# Patient Record
Sex: Female | Born: 1965 | Race: White | Hispanic: No | Smoking: Current every day smoker
Health system: Southern US, Community
[De-identification: ages and names within clinical notes are randomized; demographics above are authoritative.]

## PROBLEM LIST (undated history)

## (undated) DIAGNOSIS — R079 Chest pain, unspecified: Secondary | ICD-10-CM

## (undated) DIAGNOSIS — R296 Repeated falls: Secondary | ICD-10-CM

## (undated) DIAGNOSIS — R42 Dizziness and giddiness: Secondary | ICD-10-CM

## (undated) DIAGNOSIS — E119 Type 2 diabetes mellitus without complications: Secondary | ICD-10-CM

## (undated) DIAGNOSIS — E559 Vitamin D deficiency, unspecified: Secondary | ICD-10-CM

## (undated) DIAGNOSIS — L02811 Cutaneous abscess of head [any part, except face]: Secondary | ICD-10-CM

## (undated) DIAGNOSIS — M0529 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: Secondary | ICD-10-CM

## (undated) DIAGNOSIS — E039 Hypothyroidism, unspecified: Secondary | ICD-10-CM

## (undated) DIAGNOSIS — M791 Myalgia, unspecified site: Secondary | ICD-10-CM

## (undated) DIAGNOSIS — Z79891 Long term (current) use of opiate analgesic: Secondary | ICD-10-CM

## (undated) DIAGNOSIS — R61 Generalized hyperhidrosis: Secondary | ICD-10-CM

## (undated) DIAGNOSIS — K219 Gastro-esophageal reflux disease without esophagitis: Secondary | ICD-10-CM

## (undated) DIAGNOSIS — J349 Unspecified disorder of nose and nasal sinuses: Secondary | ICD-10-CM

## (undated) DIAGNOSIS — R634 Abnormal weight loss: Secondary | ICD-10-CM

## (undated) DIAGNOSIS — R609 Edema, unspecified: Secondary | ICD-10-CM

## (undated) DIAGNOSIS — R0989 Other specified symptoms and signs involving the circulatory and respiratory systems: Secondary | ICD-10-CM

## (undated) DIAGNOSIS — R509 Fever, unspecified: Secondary | ICD-10-CM

## (undated) DIAGNOSIS — M5441 Lumbago with sciatica, right side: Secondary | ICD-10-CM

## (undated) DIAGNOSIS — R011 Cardiac murmur, unspecified: Secondary | ICD-10-CM

## (undated) DIAGNOSIS — M1990 Unspecified osteoarthritis, unspecified site: Secondary | ICD-10-CM

## (undated) DIAGNOSIS — L209 Atopic dermatitis, unspecified: Secondary | ICD-10-CM

## (undated) DIAGNOSIS — J449 Chronic obstructive pulmonary disease, unspecified: Secondary | ICD-10-CM

## (undated) DIAGNOSIS — M542 Cervicalgia: Secondary | ICD-10-CM

## (undated) DIAGNOSIS — I350 Nonrheumatic aortic (valve) stenosis: Secondary | ICD-10-CM

## (undated) DIAGNOSIS — G4733 Obstructive sleep apnea (adult) (pediatric): Secondary | ICD-10-CM

## (undated) DIAGNOSIS — I10 Essential (primary) hypertension: Secondary | ICD-10-CM

## (undated) DIAGNOSIS — E782 Mixed hyperlipidemia: Secondary | ICD-10-CM

## (undated) DIAGNOSIS — F4310 Post-traumatic stress disorder, unspecified: Secondary | ICD-10-CM

## (undated) DIAGNOSIS — M549 Dorsalgia, unspecified: Secondary | ICD-10-CM

## (undated) DIAGNOSIS — J45909 Unspecified asthma, uncomplicated: Secondary | ICD-10-CM

## (undated) DIAGNOSIS — F330 Major depressive disorder, recurrent, mild: Secondary | ICD-10-CM

## (undated) DIAGNOSIS — L03116 Cellulitis of left lower limb: Secondary | ICD-10-CM

## (undated) DIAGNOSIS — J302 Other seasonal allergic rhinitis: Secondary | ICD-10-CM

## (undated) DIAGNOSIS — F419 Anxiety disorder, unspecified: Secondary | ICD-10-CM

## (undated) DIAGNOSIS — R51 Headache: Secondary | ICD-10-CM

## (undated) DIAGNOSIS — D519 Vitamin B12 deficiency anemia, unspecified: Secondary | ICD-10-CM

## (undated) DIAGNOSIS — R519 Headache, unspecified: Secondary | ICD-10-CM

## (undated) DIAGNOSIS — E669 Obesity, unspecified: Secondary | ICD-10-CM

## (undated) DIAGNOSIS — M79669 Pain in unspecified lower leg: Secondary | ICD-10-CM

## (undated) DIAGNOSIS — N329 Bladder disorder, unspecified: Secondary | ICD-10-CM

## (undated) DIAGNOSIS — R5383 Other fatigue: Secondary | ICD-10-CM

## (undated) DIAGNOSIS — Z9289 Personal history of other medical treatment: Secondary | ICD-10-CM

## (undated) HISTORY — DX: Lumbago with sciatica, right side: M54.41

## (undated) HISTORY — DX: Cardiac murmur, unspecified: R01.1

## (undated) HISTORY — DX: Post-traumatic stress disorder, unspecified: F43.10

## (undated) HISTORY — DX: Repeated falls: R29.6

## (undated) HISTORY — DX: Dorsalgia, unspecified: M54.9

## (undated) HISTORY — DX: Chest pain, unspecified: R07.9

## (undated) HISTORY — DX: Headache, unspecified: R51.9

## (undated) HISTORY — DX: Chronic obstructive pulmonary disease, unspecified: J44.9

## (undated) HISTORY — DX: Unspecified asthma, uncomplicated: J45.909

## (undated) HISTORY — DX: Other fatigue: R53.83

## (undated) HISTORY — DX: Vitamin D deficiency, unspecified: E55.9

## (undated) HISTORY — DX: 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: M05.29

## (undated) HISTORY — DX: Hypothyroidism, unspecified: E03.9

## (undated) HISTORY — DX: Fever, unspecified: R50.9

## (undated) HISTORY — DX: Personal history of other medical treatment: Z92.89

## (undated) HISTORY — DX: Pain in unspecified lower leg: M79.669

## (undated) HISTORY — DX: Essential (primary) hypertension: I10

## (undated) HISTORY — DX: Bladder disorder, unspecified: N32.9

## (undated) HISTORY — DX: Unspecified disorder of nose and nasal sinuses: J34.9

## (undated) HISTORY — DX: Other seasonal allergic rhinitis: J30.2

## (undated) HISTORY — DX: Type 2 diabetes mellitus without complications: E11.9

## (undated) HISTORY — DX: Gastro-esophageal reflux disease without esophagitis: K21.9

## (undated) HISTORY — DX: Other specified symptoms and signs involving the circulatory and respiratory systems: R09.89

## (undated) HISTORY — PX: GALLBLADDER SURGERY: SHX652

## (undated) HISTORY — DX: Obstructive sleep apnea (adult) (pediatric): G47.33

## (undated) HISTORY — DX: Obesity, unspecified: E66.9

## (undated) HISTORY — DX: Mixed hyperlipidemia: E78.2

## (undated) HISTORY — DX: Anxiety disorder, unspecified: F41.9

## (undated) HISTORY — DX: Vitamin B12 deficiency anemia, unspecified: D51.9

## (undated) HISTORY — DX: Dizziness and giddiness: R42

## (undated) HISTORY — PX: OTHER SURGICAL HISTORY: SHX169

## (undated) HISTORY — DX: Generalized hyperhidrosis: R61

## (undated) HISTORY — DX: Myalgia, unspecified site: M79.10

## (undated) HISTORY — DX: Long term (current) use of opiate analgesic: Z79.891

## (undated) HISTORY — DX: Headache: R51

## (undated) HISTORY — DX: Major depressive disorder, recurrent, mild: F33.0

## (undated) HISTORY — DX: Abnormal weight loss: R63.4

## (undated) HISTORY — DX: Nonrheumatic aortic (valve) stenosis: I35.0

## (undated) HISTORY — PX: CHOLECYSTECTOMY: SHX55

## (undated) HISTORY — DX: Edema, unspecified: R60.9

## (undated) HISTORY — DX: Cellulitis of left lower limb: L03.116

## (undated) HISTORY — DX: Cervicalgia: M54.2

## (undated) HISTORY — PX: SPLENECTOMY, TOTAL: SHX788

## (undated) HISTORY — DX: Cutaneous abscess of head (any part, except face): L02.811

## (undated) HISTORY — DX: Unspecified osteoarthritis, unspecified site: M19.90

## (undated) HISTORY — DX: Atopic dermatitis, unspecified: L20.9

---

## 2008-11-21 ENCOUNTER — Inpatient Hospital Stay (HOSPITAL_COMMUNITY): Admission: RE | Admit: 2008-11-21 | Discharge: 2008-11-23 | Payer: Self-pay | Admitting: Cardiology

## 2008-11-21 ENCOUNTER — Ambulatory Visit: Payer: Self-pay | Admitting: Cardiology

## 2008-11-23 ENCOUNTER — Encounter: Payer: Self-pay | Admitting: Cardiology

## 2009-08-16 IMAGING — CR DG CHEST 2V
2 series · 2 of 2 positions shown · non-contrast
Comparison: None

CLINICAL DATA: Precardiac catheterization workup.  Chest pain.
Hypertension.

CHEST - 2 VIEW

[view not recorded (1 of 2)]
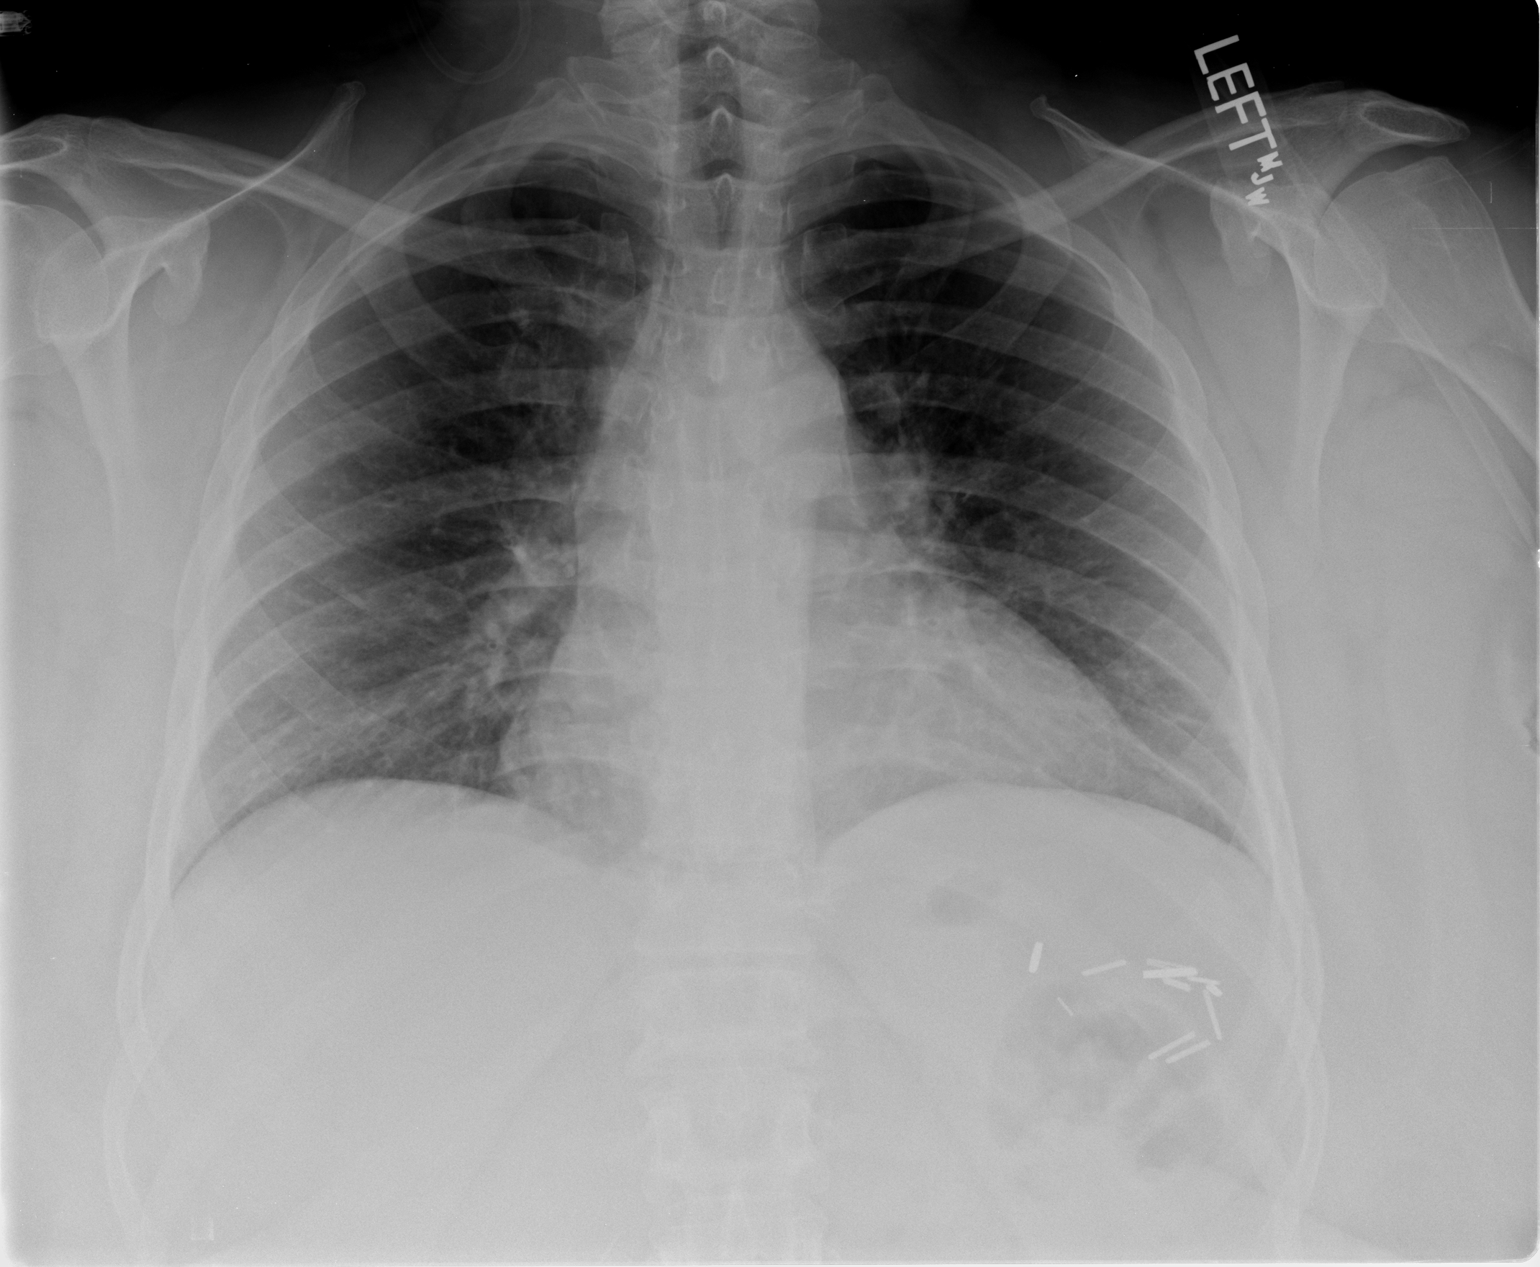

[view not recorded (2 of 2)]
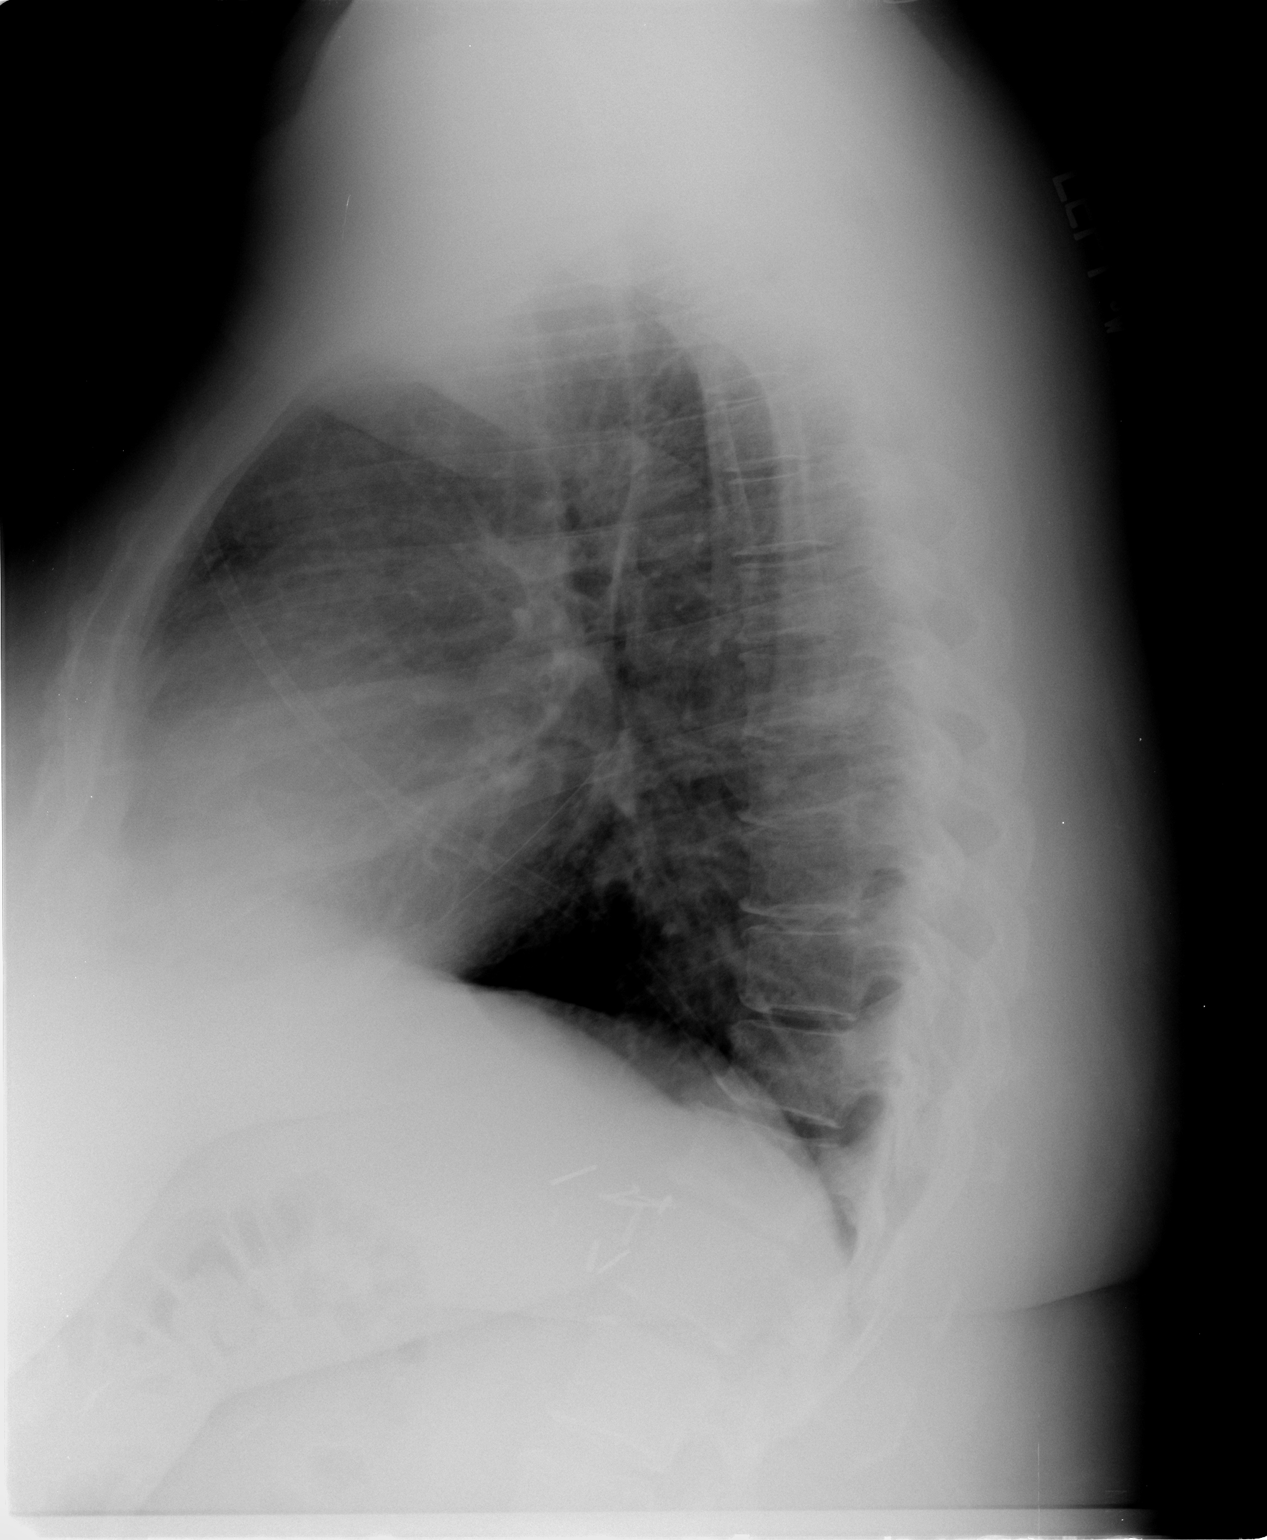

[2 of 2 positions shown; findings below may reference images not displayed]

FINDINGS: No acute chest findings are present.  There is
peribronchial thickening bilaterally compatible with chronic
bronchitic changes.  Heart appears mildly enlarged.  Query right
ventricular hypertrophy on the lateral view.  Metallic surgical
clips in the left upper quadrant.
IMPRESSION: Chronic bronchitic changes.  Cardiomegaly.

## 2009-08-17 IMAGING — CT CT ANGIO CHEST
3 of 8 series · 16 of 36 positions shown · IV contrast (APPLIED)
Comparison: Chest radiograph from 11/21/2008

CLINICAL DATA: Chest pain.  Evaluate for pulmonary embolism.
History of spherocytosis.

CT ANGIOGRAPHY CHEST
TECHNIQUE: Multidetector CT imaging of the chest using the
standard protocol during bolus administration of intravenous
contrast. Multiplanar reconstructed images including MIPs were
obtained and reviewed to evaluate the vascular anatomy.
Contrast: 171 ml Omnipaque

[Series 11: pulm embolism 1.0 b25f thins · axial · 0.65mm/px · z∈[-142,+78]mm · 11 of 264 slices shown]
[im 22/264  lung]
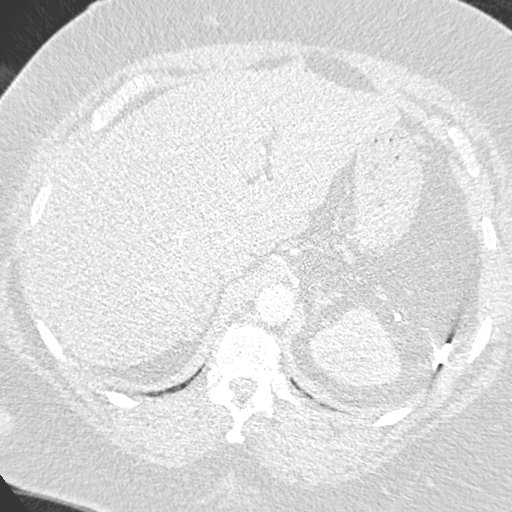
[im 44/264  mediastinal]
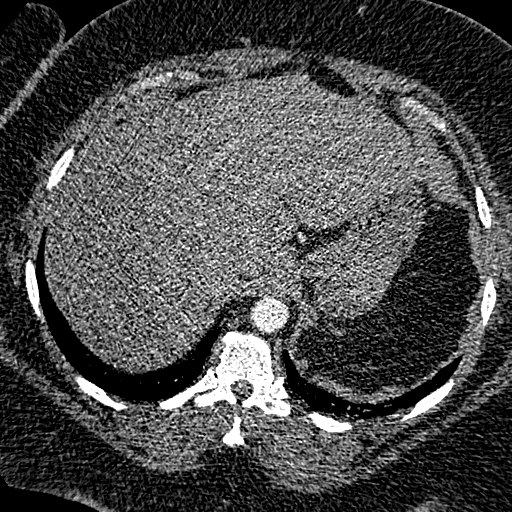
[im 66/264  lung]
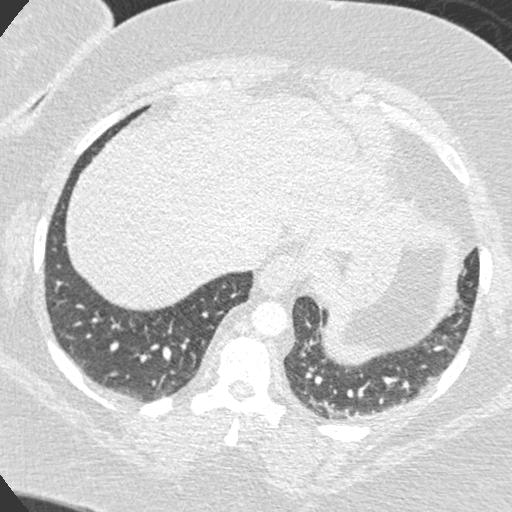
[im 88/264  mediastinal]
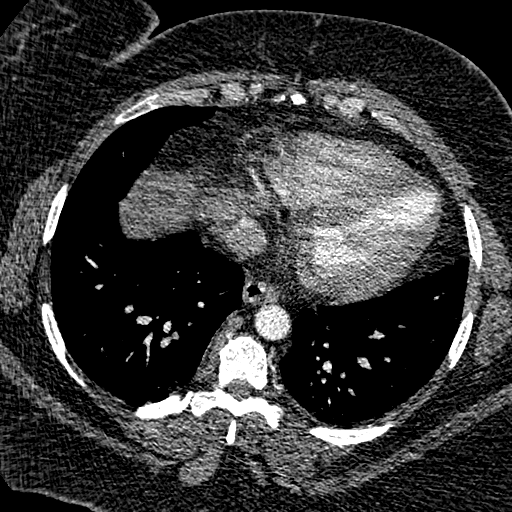
[im 110/264  lung]
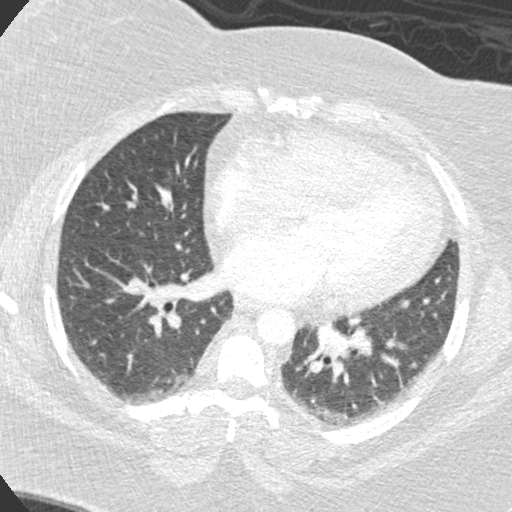
[im 132/264  mediastinal]
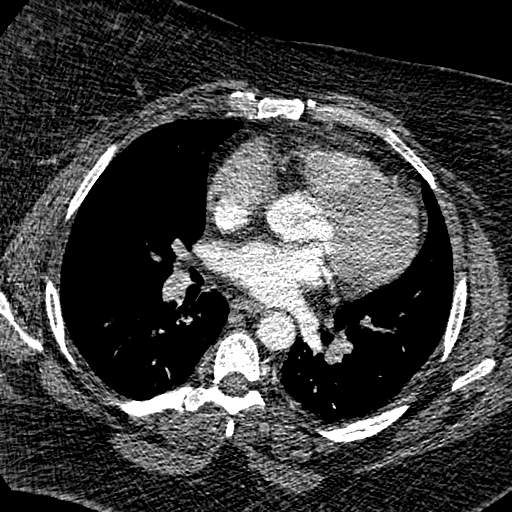
[im 154/264  lung]
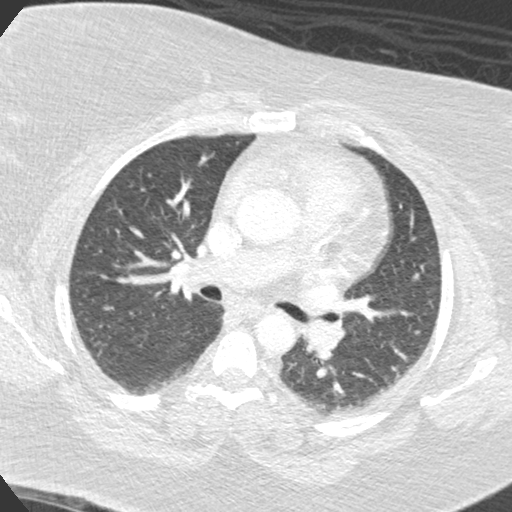
[im 176/264  mediastinal]
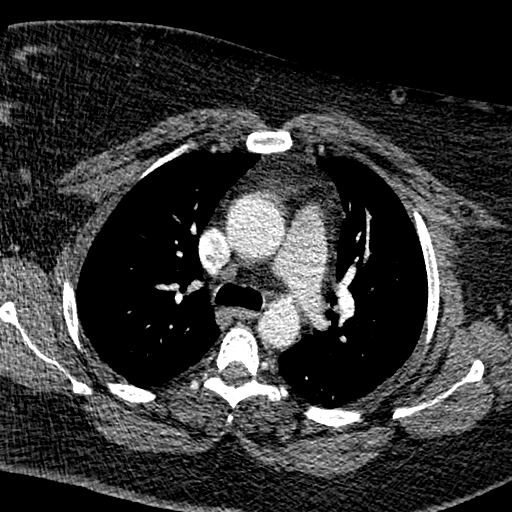
[im 198/264  lung]
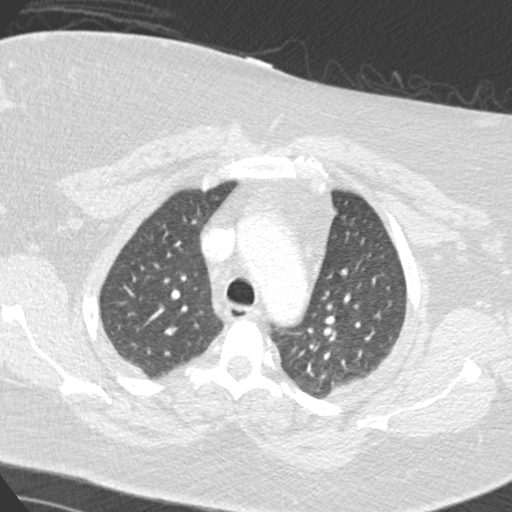
[im 220/264  mediastinal]
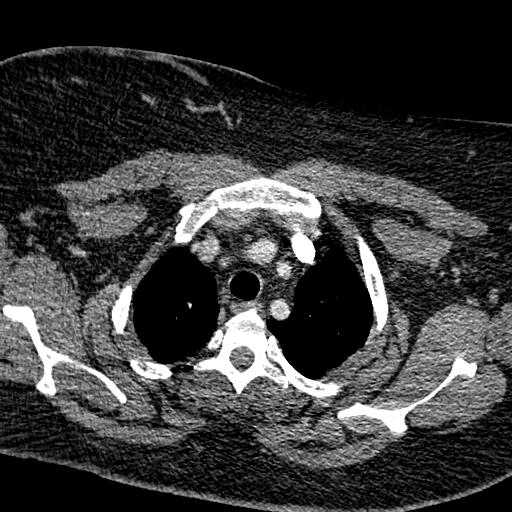
[im 242/264  lung]
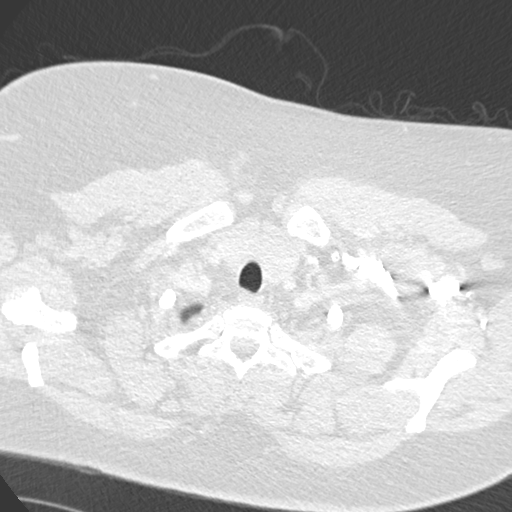

[Series 13: pulm embolism 3.0 b60f lung · axial · 0.63mm/px · z∈[-78,+4]mm · 2 of 83 slices shown]
[im 28/83  mediastinal]
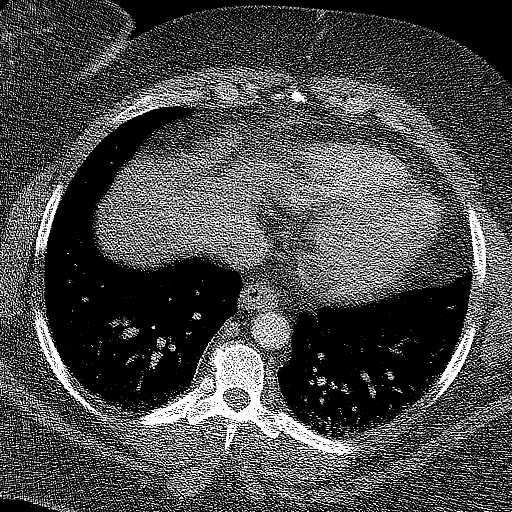
[im 55/83  mediastinal]
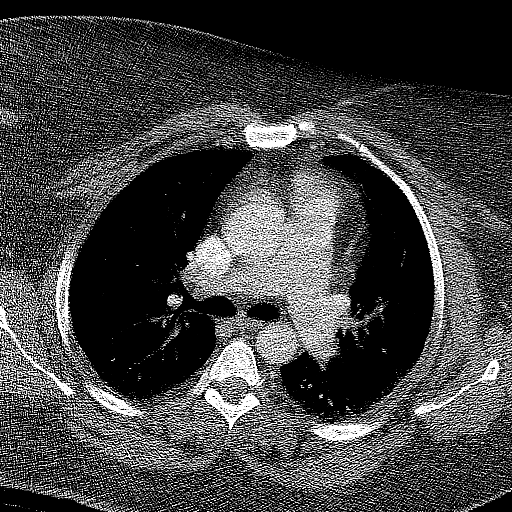

[Series 602: cor angio chest · coronal · 0.63mm/px · 3 of 113 slices shown]
[im 23/113  mediastinal]
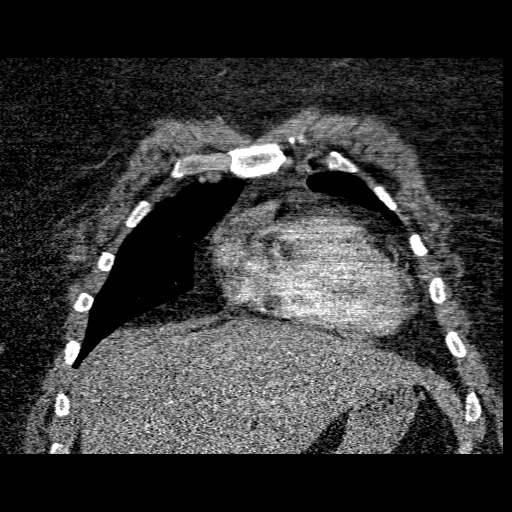
[im 45/113  mediastinal]
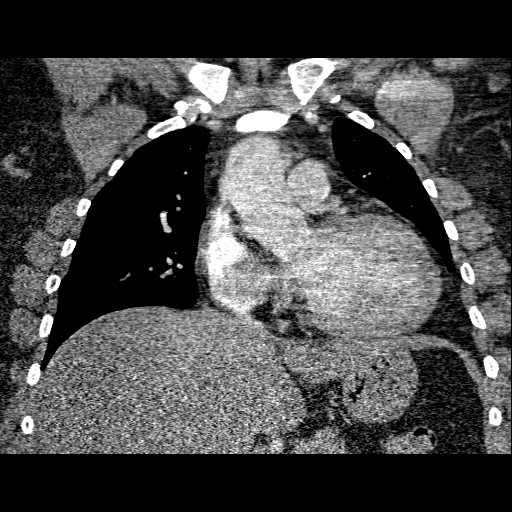
[im 68/113  mediastinal]
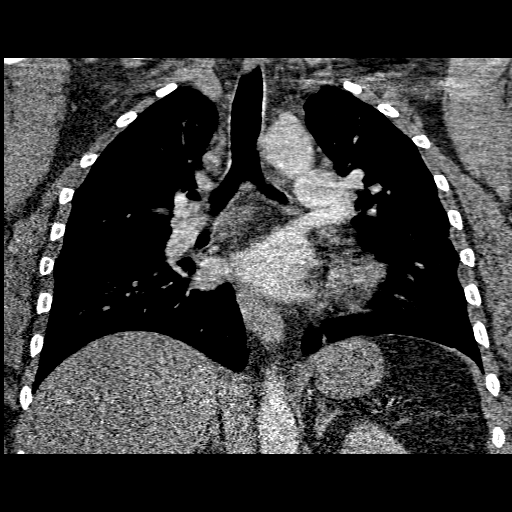

[16 of 36 positions shown; findings below may reference images not displayed]

FINDINGS: Images of the upper abdomen demonstrate changes from a
splenectomy.  No gross abnormality involving the visualized adrenal
structures. The study is nondiagnostic for pulmonary emboli,
despite two attempts to opacify the pulmonary arteries.  The
pulmonary arteries are not adequately opacified.  There is no
significant pericardial or pleural fluid.  There is no significant
chest lymphadenopathy.  There are no gross abnormalities to the
thoracic aorta or great vessels.  The trachea and mainstem bronchi
are patent.  Lungs are clear with exception of dependent
atelectasis.  There are no acute bony abnormalities.
IMPRESSION: Nondiagnostic study for pulmonary embolism.  The patient may be a
candidate for a V/Q scan due to the clear lungs.

No focal parenchymal disease.

Splenectomy.

## 2010-03-14 ENCOUNTER — Ambulatory Visit: Payer: Self-pay | Admitting: Orthopedic Surgery

## 2010-03-14 DIAGNOSIS — M7150 Other bursitis, not elsewhere classified, unspecified site: Secondary | ICD-10-CM

## 2010-03-14 HISTORY — DX: Other bursitis, not elsewhere classified, unspecified site: M71.50

## 2010-05-13 ENCOUNTER — Ambulatory Visit (HOSPITAL_COMMUNITY): Admission: RE | Admit: 2010-05-13 | Discharge: 2010-05-13 | Payer: Self-pay | Admitting: Family Medicine

## 2010-11-03 ENCOUNTER — Emergency Department (HOSPITAL_COMMUNITY)
Admission: EM | Admit: 2010-11-03 | Discharge: 2010-11-03 | Payer: Self-pay | Source: Home / Self Care | Admitting: Emergency Medicine

## 2011-01-21 NOTE — Assessment & Plan Note (Signed)
Summary: rt thiumb trigger finger/[place] medicaid/d allen/bsf   Vital Signs:  Patient profile:   45 year old female Height:      68 inches Weight:      357 pounds Pulse rate:   80 / minute Resp:     18 per minute  Vitals Entered By: Fuller Canada MD (March 14, 2010 10:33 AM)  Visit Type:  Initial Consult Referring Provider:  Dr. Freida Busman Primary Provider:  Dr. Freida Busman [facility]  CC:   thumb pain.  History of Present Illness: 45 year old female with catching locking and pain over the RIGHT thumb presents for evaluation.  She does have catching locking episode which recurred associated with severe pain   Meds: Benazepril, Doxepin, Potassium, Simvastatin, Lasix, Tramadol, ASA, Fish oil.     Allergies (verified): No Known Drug Allergies  Past History:  Past Medical History: htn arthritis cholesterol sleep apnea chronic back pain heart murmur  Past Surgical History:  spleen removed intestinal block gallstones biopsy left breast  Family History: FH of Cancer:  Family History of Diabetes Family History Coronary Heart Disease female < 20 Family History of Arthritis Hx, family, chronic respiratory condition Hx, family, asthma Hx, family, kidney disease NEC  Social History: Patient is widowed.  unemployed trying for disability no smoking no alcohol very little caffeine use  Review of Systems Constitutional:  Complains of weight gain, fever, chills, and fatigue; denies weight loss. Cardiovascular:  Complains of chest pain and murmurs; denies palpitations and fainting. Respiratory:  Complains of short of breath, wheezing, couch, tightness, and snoring; denies pain on inspiration and snoring . Gastrointestinal:  Complains of heartburn and constipation; denies nausea, vomiting, diarrhea, and blood in your stools. Genitourinary:  Complains of frequency; denies urgency, difficulty urinating, painful urination, flank pain, and bleeding in urine. Neurologic:  Complains  of numbness, tingling, and dizziness; denies unsteady gait, tremors, and seizure. Musculoskeletal:  Complains of joint pain, swelling, instability, stiffness, redness, heat, and muscle pain. Endocrine:  Denies excessive thirst, exessive urination, and heat or cold intolerance. Psychiatric:  Complains of nervousness, depression, and anxiety; denies hallucinations. Skin:  Complains of rash and itching; denies changes in the skin, poor healing, and redness. HEENT:  Complains of blurred or double vision and watering; denies eye pain and redness; headache. Immunology:  Complains of seasonal allergies; denies sinus problems and allergic to bee stings. Hemoatologic:  Complains of brusing; denies easy bleeding.  Physical Exam  Additional Exam:  vital signs as recorded.  Patient body habitus large.  Pulse are perfusion normal in the RIGHT thumb.  Tenderness over the A1 pulley palpable nodule in the flexor tendon normal range of motion in the thumb thumb is stable normal flexion strength in the flexor to the IP joint  Skin intact  Patient awake alert oriented x3 mood normal sensation normal     Impression & Recommendations:  Problem # 1:  TRIGGER FINGER (ICD-727.03) Assessment New inject RIGHT thumb trigger Verbal consent was obtained: The finger was prepped with ethyl chloride and injected with 1:1 injection of .25% sensorcaine, 1cc  and 40 mg of depomedrol, 1cc. There were no complications.  Other Orders: New Patient Level II (04540) Depo- Medrol 40mg  (J1030) Joint Aspirate / Injection, Small (98119)  Patient Instructions: 1)  You have received an injection of cortisone today. You may experience increased pain at the injection site. Apply ice pack to the area for 20 minutes every 2 hours and take 2 xtra strength tylenol every 8 hours. This increased pain will usually  resolve in 24 hours. The injection will take effect in 3-10 days.  2)  Please schedule a follow-up appointment as needed.

## 2011-01-21 NOTE — Letter (Signed)
Summary: History form  History form   Imported By: Jacklynn Ganong 03/19/2010 12:57:35  _____________________________________________________________________  External Attachment:    Type:   Image     Comment:   External Document

## 2011-02-04 ENCOUNTER — Other Ambulatory Visit (HOSPITAL_COMMUNITY): Payer: Self-pay

## 2011-02-24 ENCOUNTER — Ambulatory Visit (HOSPITAL_COMMUNITY): Payer: Self-pay

## 2011-03-04 LAB — POCT I-STAT, CHEM 8
BUN: 10 mg/dL (ref 6–23)
Calcium, Ion: 1.06 mmol/L — ABNORMAL LOW (ref 1.12–1.32)
Chloride: 99 mEq/L (ref 96–112)
Creatinine, Ser: 0.7 mg/dL (ref 0.4–1.2)
Potassium: 3.7 mEq/L (ref 3.5–5.1)
Sodium: 136 mEq/L (ref 135–145)

## 2011-03-04 LAB — URINALYSIS, ROUTINE W REFLEX MICROSCOPIC
Bilirubin Urine: NEGATIVE
Glucose, UA: NEGATIVE mg/dL
Leukocytes, UA: NEGATIVE

## 2011-03-04 LAB — URINE MICROSCOPIC-ADD ON

## 2011-03-04 LAB — GLUCOSE, CAPILLARY: Glucose-Capillary: 259 mg/dL — ABNORMAL HIGH (ref 70–99)

## 2011-03-05 ENCOUNTER — Ambulatory Visit (HOSPITAL_COMMUNITY): Payer: Self-pay

## 2011-05-06 NOTE — Discharge Summary (Signed)
Paige Jarvis, [place] NO.:  [id number]   MEDICAL RECORD NO.:  [id number]          PATIENT TYPE:  INP   LOCATION:  2022                         FACILITY:  [facility]   PHYSICIAN:  Arturo Morton. Paige Kill, MD, FACCDATE OF BIRTH:  [birth year]   DATE OF ADMISSION:  11/21/2008  DATE OF DISCHARGE:  11/23/2008                               DISCHARGE SUMMARY   PRIMARY CARDIOLOGIST:  [name], MD, in [place].   PRIMARY CARE Paige Jarvis:  [name] in [place].    CARDIOLOGISTArturo Morton. Paige Kill, MD, [facility]   PROCEDURES PERFORMED DURING HOSPITALIZATION:  Cardiac catheterization.  This was completed by [name] on November 21, 2008.  a.  Hyperdynamic overall left ventricular function, history of  obstructive sleep apnea, calcified coronary artery disease involving all  3 vessels but with noncritical plaque; treat medically.  b.  Please see [name] thorough cardiac catheterization note for  more detailed information.   FINAL DISCHARGE DIAGNOSES:  1. Chronic chest pain.      a.     Status post recent stress Myoview that was negative for       ischemia with an ejection fraction of 65%.      b.     Status post cardiac catheterization on November 21, 2008,       revealing nonobstructive coronary artery disease.  2. Morbid obesity.  3. Hypertension.  4. Dyslipidemia.  5. Sleep apnea.  6. Anemia.  7. Anxiety.  8. Depressive disorder.  9. Urinary tract infection.  10.Severe dental caries and need of removal.   HOSPITAL COURSE:  This is a 42-year obese Caucasian female we are seeing  from [place], [place], who is normally followed by Dr. Thereasa Solo  Dhatt.  We are seeing her today for cardiac catheterization in the  setting of ongoing chest discomfort, dyspnea, and status post stress  test that was negative but has continued to have ongoing chest  discomfort.  The patient was seen and examined by [name] prior  to catheterization, and risks and  benefits were discussed with the  patient and she consented to proceed.  The patient did undergo cardiac  catheterization on November 21, 2008, with results as described above.  The patient has nonobstructive coronary artery disease and will be  treated medically.   The patient was started on nitrates secondary to recurrence of chest  discomfort.  She did have some headache with initiation of the  isosorbide mononitrate, but it is beginning to subside.  The patient  also had some complaints of dysuria during hospitalization.  Urinalysis  was sent for culture, and the patient was started on Cipro 250 mg p.o.  b.i.d. for 5 days.   The patient also had echocardiogram completed during hospitalization on  January 25, 2008, revealing overall left ventricular systolic function  was normal, left ventricular ejection fraction estimated range being 55-  65%, left ventricular wall thickness was mildly increased per [name], cardiologist.   The patient was seen by [name] on day of discharge and a  lengthy discussion greater than 45 minutes  to an hour was had between  Dr. Riley Jarvis and the patient to address all of her concerns and issues.  The patient was reassured concerning her cardiac disease.  He feels that  initially some of this chest discomfort may be related to stress and  sleep apnea.  She has a low likelihood of a PE.  The patient was  explained that nitrates will be added to her current medication regimen.  He had also discussed the need to have her teeth pulled to decrease  inflammatory load with the risk of ACS.  The patient also discussed at  length her insurance issues and she is trying to get Medicaid as she has  not been able to have insurance.  She had been on her husband's  insurance in the past.  Also, the patient had discussion with Dr.  Riley Jarvis concerning weight loss.  He discussed need for increasing  exercise and diet control to assist in this.  Also, Dr.  Riley Jarvis  discussed her UTI and need for antibiotics.  The patient verbalized  understanding.  Dr. Riley Jarvis did send a good bit of time reassuring the  patient after many questions and anxiety was expressed concerning her  health status.  He advices that she follow up with [name] and Dr.  Synetta Fail, primary care physician for continued medical management and  cardiac management with only changes in medicine the addition of  isosorbide and temporary Cipro for UTI.   DISCHARGE LABORATORY DATA:  Sodium 133, potassium 4.0, chloride 101, CO2  of 25, glucose 118, BUN 10, creatinine 0.94.  UA positive for UTI.  Cardiac enzymes were found to be negative x3.  D-dimer 0.58.   Vital signs at discharge, blood pressure 112/74, pulse 82, respirations  20, temperature 98.3, O2 sat 98% on room air.   CT of the chest dated November 22, 2008, revealed nondiagnostic study for  pulmonary embolism.  The patient may be a candidate for V/Q lung scan  due to clear lungs.  Chest x-ray dated November 21, 2008, chronic  bronchitic changes and cardiomegaly.   DISCHARGE MEDICATIONS:  1. Isosorbide mononitrate 30 mg daily (prescription provided).  2. Cipro 250 mg b.i.d. for 5 days (prescription provided).  3. Protonix 40 mg daily (new prescription provided).  4. Diovan 320 mg daily.  5. Lipitor 20 mg daily.  6. Niacin 500 mg daily.  7. Aspirin 81 mg daily.  8. Fish oil 1200 mg daily.  9. Nitroglycerin sublingual p.r.n. chest pain.   ALLERGIES:  PENICILLIN.   FOLLOWUP PLANS AND APPOINTMENT:  1. The patient has been advised to call [name], cardiologist for a      followup within the next week.  2. The patient has been advised to call [name] for followup for      medical management within the next week.  3. The patient has been advised on post-cardiac catheterization      instructions and activity with particular emphasis on the right      groin site for evidence of bleeding, hematoma, or signs of       infection.  4. The patient has been given prescriptions for Protonix, Cipro, and      isosorbide.  5. The patient has been advised to bring all medications to followup      appointments with cardiologist and primary care physician.   TIME SPENT WITH THE PATIENT TO INCLUDE PHYSICIAN TIME:  90 minutes.      Bettey Mare. Lyman Bishop, NP  Arturo Morton. Paige Kill, MD, [facility]  Electronically Signed    KML/MEDQ  D:  11/23/2008  T:  11/24/2008  Job:  [id number]   cc:   [name] in [facility] D[place], M.D.

## 2011-05-06 NOTE — Cardiovascular Report (Signed)
Paige Jarvis, Paige Jarvis NO.:  [id number]   MEDICAL RECORD NO.:  [id number]           PATIENT TYPE:   LOCATION:                                 FACILITY:   PHYSICIAN:  Arturo Morton. [name], MD, FACCDATE OF BIRTH:  Aug 24, 1966   DATE OF PROCEDURE:  DATE OF DISCHARGE:                            CARDIAC CATHETERIZATION   INDICATIONS:  Paige Jarvis is a very pleasant 45 year old female who has a  history of hereditary spherocytosis.  She has had prior splenectomy and  cholecystectomy.  She has had recurrent episodes of substernal chest  discomfort.  She needs to have knee surgery and unfortunately has had  recurrent episodes of pain.  She also has a history of hypertension,  obesity, and sleep apnea.  She stopped smoking about a year ago.  The  discomfort was associated with shortness of breath, it is not always  associated with exertion, and may last for up to 30-60 minutes.  A D-  dimer here was minimally elevated, and chest x-ray revealed chronic  bronchitic changes, otherwise unremarkable.  The patient has  subsequently been referred by [name] for further evaluation.  Radionuclide imaging study did not reveal a significant perfusion  deficit.  She was ultimately referred on for a diagnostic study.  The  patient also had an adenosine radionuclide imaging study which did not  reveal significant ischemia.  Her electrocardiogram does not suggest  right heart strain.  The risks, benefits, and alternatives were  discussed with the patient.  She consented to proceed.   PROCEDURES:  1. Left heart catheterization.  2. Selective coronary arteriography.  3. Selective left ventriculography.   DESCRIPTION OF THE PROCEDURE:  The patient was brought to the  catheterization laboratory after informed consent and prepped and draped  in the usual fashion.  Through an anterior puncture, the femoral artery  was entered using a Smart needle before a stick, a 5-French sheath was  then  placed.  Following this, multiple views of the left coronary artery  were obtained with multiple angiographic projections.  The right  coronary arteriography was also performed.  Central aortic and left  ventricular pressures were measured with pigtail.  Ventriculography was  performed in the ROA projection.  Note, she tolerated the procedure well  and was taken to the holding area in satisfactory clinical condition.   HEMODYNAMIC DATA:  1. Central aortic pressure was 135/86, mean of 107.  2. Left ventricular pressure is 127/9.  3. There does not appear to be a significant gradient pullback across      the aortic valve.   ANGIOGRAPHIC DATA:  1. Left ventricular function was hyperdynamic.  Overall, EF was      greater than 70%, no definite wall motion abnormalities were seen.  2. On plain fluoroscopy, there is fairly significant evidence of      calcification of the coronaries.  3. The left main is free of critical disease.  4. The proximal left anterior descending artery is a fairly heavily      calcified vessel.  It is segmentally plaqued in proximal portion.  There is probably up to about 40% ostial calcified plaque, but it      does not appear to be critical.  The LAD after this is also mildly      calcified and there are some diffuse luminal irregularities but it      is fairly large in caliber and free of critical disease including a      fairly large diagonal branch.  5. The circumflex provides a bifurcating ramus and/or first marginal      branch that probably has about 30-40% ostial plaquing.  The mid      circumflex itself has calcified diffuse luminal irregularities with      40-60% irregularities with probable focal 60%, just after the      takeoff of an AV branch, none of this appears to be critical.  6. The right coronary artery is also calcified with mid plaque of      about 30%.  The PDA and posterolateral are without critical      narrowing.   CONCLUSIONS:  1.  Hyperdynamic overall left ventricular function.  2. History of obstructive sleep apnea.  3. Calcified coronary artery disease involving all 3 vessels but with      noncritical plaque.   DISPOSITION:  Based on the current angiographic findings and also the  radionuclide imaging study, it would be hard to ascribe ischemia to a  single focal area.  Based on the above findings, my leaning would be in  the direction of aggressive medical therapy.  The description of her  chest discomfort is somewhat atypical for this type of diffuse plaque,  but there is not much question that she does have a coronary disease, it  does not appear to be critical.  I will have her see [name] back in  early followup, and the addition of nitrates would at least be  recommended.  At the present time, initial therapy would be best served  by medication approach.      Arturo Morton. [name], MD, [facility]  Electronically Signed     TDS/MEDQ  D:  11/21/2008  T:  11/22/2008  Job:  [id number]   cc:   Joylene Grapes  Dr. Thereasa Solo Dhatt

## 2011-05-06 NOTE — H&P (Signed)
Paige Jarvis, Paige Jarvis NO.:  [id number]   MEDICAL RECORD NO.:  [id number]          PATIENT TYPE:  OIB   LOCATION:  2022                         FACILITY:  [facility]   PHYSICIAN:  Arturo Morton. [name], MD, FACCDATE OF BIRTH:  02-Jan-1966   DATE OF ADMISSION:  11/21/2008  DATE OF DISCHARGE:                              HISTORY & PHYSICAL   CARDIOLOGIST:  New, will be seen by Dr. Shawnie Pons.   PRIMARY CARDIOLOGIST:  [name], MD, in [place].   ORTHOPEDIC SURGEON:  Dr. Thedore Mins.   PRIMARY CARE Betha Shadix:  [name] in [place].   Paige Jarvis is a 45 year old Caucasian female from [place].  She is  followed by Dr. Thereasa Solo Dhatt.  Paige Jarvis is here today for cardiac  catheterization in the setting of ongoing chest discomfort, dyspnea,  status post recent stress Myoview that is negative for ischemia with an  EF of 65%.  The patient is also needing cardiac clearance for a pending  knee surgery.  Paige Jarvis complains of shortness of breath x2 years.  Chest discomfort described as a heavy tightness associated with any  activity or position.  Paige Jarvis has a history of premature CAD in her  family.  She complains of increased chest discomfort of the last 3-4  weeks with increased dyspnea.  Chest discomfort is usually associated  with exertion and the pain has radiated to her left arm.  At times, Ms.  Jarvis states she has become diaphoretic with discomfort.  She then  states the chest pain has also been brought on at rest and may last from  30-60 minutes.  Somewhat poor historian.  She states she does not wake  up with the pain at night but sometimes it keeps her awake.   PAST MEDICAL HISTORY:  1. Morbid obesity.  2. Hypertension.  3. Dyslipidemia.  4  Sleep apnea.  1. Anemia.  2. Anxiety.  3. Depression disorder.   SOCIAL HISTORY:  Paige Jarvis lives in [place].  She is a widow.  She has  a teenage son.  She is on disability.  She quit smoking in 2008.  Denies  any diet, EtOH, drug, or herbal medication use.  No diet restrictions.  She is not able to exercise.   FAMILY HISTORY:  Mother is alive but has a history of coronary artery  disease status post cardiac stents, also history of CVA, hypertension,  high cholesterol, and diabetes.  Father is alive.  He has a diagnosis of  hereditary spherocytosis.  Siblings; one sister died of an overdose, one  sister with ischemic heart disease, and one sister has hereditary  spherocytosis.  Also, a history of ischemic heart disease in  grandmother, a sudden early death of an uncle in his 47s from MI.   REVIEW OF SYSTEMS:  Positive for occasional sweats, shortness of breath,  dyspnea on exertion, and chest pain as described above.  The patient  denies any orthopnea, PND, edema, palpitations, presyncope, syncopal  episodes, claudication, cough, or wheezing.  Complains of pain in knees.  All other systems reviewed and negative.   ALLERGIES:  PENICILLIN.   MEDICATIONS:  1. Diovan 320.  2. Lipitor 20.  3. Niacin 500.  4. Aspirin 81.  5. Fish oil 1200.  6. Nitroglycerin p.r.n.   PHYSICAL EXAMINATION:  VITAL SIGNS:  Temperature 97, heart rate 86,  respirations 18, blood pressure 127/75, and saturation 97% on 2 L.  GENERAL:  She is in no acute distress, currently having chest pain,  initially of 5, described as substernal heaviness and tightness, rating  at 5.  After 1 nitroglycerin and 2 morphine, chest pain is currently 3.  HEENT:  Normocephalic and atraumatic.  Pupils equal, round, and reactive  to light.  Sclerae are clear.  Very poor dentition, multiple caries  noted.  NECK:  Supple without lymphadenopathy.  No bruit.  No JVD.  CARDIOVASCULAR:  S1 and S2.  Regular rate and rhythm.  LUNGS:  Clear to auscultation.  Distant breath sounds at bilateral  bases.  SKIN:  Warm and dry.  ABDOMEN:  Obese, soft, and nontender.  Positive bowel sounds.  LOWER EXTREMITIES:  Without clubbing, cyanosis, or  edema.  NEUROLOGICAL:  Alert and oriented x3.   Chest x-ray is pending.  EKG, sinus rhythm at a rate of 90 without acute  ST or T-wave changes.  Lab work is pending.   IMPRESSION:  Chest pain.  Negative stress test, however, with multiple  risk factors for coronary artery disease.  A 12-lead EKG showed no acute  findings.  Lab work is pending.  The patient for cardiac catheterization  today for further clarification.  We will continue home medications.  Cath today.  Further workup pending results.  [name] has been  into exam and assess the patient and agrees with plan of care.      Dorian Pod, ACNP      Arturo Morton. [name], MD, [facility]  Electronically Signed    MB/MEDQ  D:  11/21/2008  T:  11/21/2008  Job:  [id number]

## 2011-09-26 LAB — COMPREHENSIVE METABOLIC PANEL
ALT: 22 U/L (ref 0–35)
AST: 27 U/L (ref 0–37)
Albumin: 3 g/dL — ABNORMAL LOW (ref 3.5–5.2)
Alkaline Phosphatase: 69 U/L (ref 39–117)
BUN: 10 mg/dL (ref 6–23)
CO2: 25 mEq/L (ref 19–32)
Chloride: 101 mEq/L (ref 96–112)
Creatinine, Ser: 0.94 mg/dL (ref 0.4–1.2)
GFR calc Af Amer: >60 mL/min (ref >60)
GFR calc non Af Amer: >60 mL/min (ref >60)
Glucose, Bld: 118 mg/dL — ABNORMAL HIGH (ref 70–99)
Total Bilirubin: 0.6 mg/dL (ref 0.3–1.2)
Total Protein: 6.5 g/dL (ref 6.0–8.3)

## 2011-09-26 LAB — BASIC METABOLIC PANEL
BUN: 10 mg/dL (ref 6–23)
BUN: 11 mg/dL (ref 6–23)
CO2: 25 mEq/L (ref 19–32)
Calcium: 9.7 mg/dL (ref 8.4–10.5)
Chloride: 102 mEq/L (ref 96–112)
GFR calc Af Amer: >60 mL/min (ref >60)
Glucose, Bld: 117 mg/dL — ABNORMAL HIGH (ref 70–99)
Sodium: 134 mEq/L — ABNORMAL LOW (ref 135–145)
Sodium: 134 mEq/L — ABNORMAL LOW (ref 135–145)

## 2011-09-26 LAB — URINALYSIS, ROUTINE W REFLEX MICROSCOPIC
Bilirubin Urine: NEGATIVE
Glucose, UA: NEGATIVE mg/dL
Hgb urine dipstick: NEGATIVE
Ketones, ur: NEGATIVE mg/dL
pH: 6 (ref 5.0–8.0)

## 2011-09-26 LAB — PROTIME-INR
INR: 1 (ref 0.00–1.49)
Prothrombin Time: 12.8 seconds (ref 11.6–15.2)

## 2011-09-26 LAB — DIFFERENTIAL
Eosinophils Absolute: 0.1 10*3/uL (ref 0.0–0.7)
Eosinophils Relative: 1 % (ref 0–5)
Monocytes Absolute: 0.6 10*3/uL (ref 0.1–1.0)
Neutro Abs: 8 10*3/uL — ABNORMAL HIGH (ref 1.7–7.7)
Neutrophils Relative %: 63 % (ref 43–77)

## 2011-09-26 LAB — CBC
Hemoglobin: 12.9 g/dL (ref 12.0–15.0)
MCHC: 35.7 g/dL (ref 30.0–36.0)
MCHC: 36.7 g/dL — ABNORMAL HIGH (ref 30.0–36.0)
MCV: 89.2 fL (ref 78.0–100.0)

## 2011-09-26 LAB — CARDIAC PANEL(CRET KIN+CKTOT+MB+TROPI)
Relative Index: INVALID (ref 0.0–2.5)
Total CK: 88 U/L (ref 7–177)
Troponin I: <0.01 ng/mL (ref 0.00–0.06)

## 2011-09-26 LAB — URINE MICROSCOPIC-ADD ON

## 2013-11-30 ENCOUNTER — Ambulatory Visit: Payer: Self-pay

## 2014-01-04 ENCOUNTER — Ambulatory Visit: Payer: Self-pay

## 2014-01-12 ENCOUNTER — Ambulatory Visit (INDEPENDENT_AMBULATORY_CARE_PROVIDER_SITE_OTHER): Payer: Medicaid Other

## 2014-01-12 VITALS — BP 158/106 | HR 84 | Resp 18

## 2014-01-12 DIAGNOSIS — E1149 Type 2 diabetes mellitus with other diabetic neurological complication: Secondary | ICD-10-CM

## 2014-01-12 DIAGNOSIS — B351 Tinea unguium: Secondary | ICD-10-CM

## 2014-01-12 DIAGNOSIS — E1140 Type 2 diabetes mellitus with diabetic neuropathy, unspecified: Secondary | ICD-10-CM

## 2014-01-12 DIAGNOSIS — Z79899 Other long term (current) drug therapy: Secondary | ICD-10-CM

## 2014-01-12 DIAGNOSIS — M79609 Pain in unspecified limb: Secondary | ICD-10-CM

## 2014-01-12 DIAGNOSIS — E1142 Type 2 diabetes mellitus with diabetic polyneuropathy: Secondary | ICD-10-CM

## 2014-01-12 MED ORDER — GABAPENTIN 300 MG PO CAPS: ORAL_CAPSULE | ORAL | Status: DC

## 2014-01-12 NOTE — Patient Instructions (Signed)
Diabetes and Foot Care Diabetes may cause you to have problems because of poor blood supply (circulation) to your feet and legs. This may cause the skin on your feet to become thinner, break easier, and heal more slowly. Your skin may become dry, and the skin may peel and crack. You may also have nerve damage in your legs and feet causing decreased feeling in them. You may not notice minor injuries to your feet that could lead to infections or more serious problems. Taking care of your feet is one of the most important things you can do for yourself.  HOME CARE INSTRUCTIONS  Wear shoes at all times, even in the house. Do not go barefoot. Bare feet are easily injured.  Check your feet daily for blisters, cuts, and redness. If you cannot see the bottom of your feet, use a mirror or ask someone for help.  Wash your feet with warm water (do not use hot water) and mild soap. Then pat your feet and the areas between your toes until they are completely dry. Do not soak your feet as this can dry your skin.  Apply a moisturizing lotion or petroleum jelly (that does not contain alcohol and is unscented) to the skin on your feet and to dry, brittle toenails. Do not apply lotion between your toes.  Trim your toenails straight across. Do not dig under them or around the cuticle. File the edges of your nails with an emery board or nail file.  Do not cut corns or calluses or try to remove them with medicine.  Wear clean socks or stockings every day. Make sure they are not too tight. Do not wear knee-high stockings since they may decrease blood flow to your legs.  Wear shoes that fit properly and have enough cushioning. To break in new shoes, wear them for just a few hours a day. This prevents you from injuring your feet. Always look in your shoes before you put them on to be sure there are no objects inside.  Do not cross your legs. This may decrease the blood flow to your feet.  If you find a minor scrape,  cut, or break in the skin on your feet, keep it and the skin around it clean and dry. These areas may be cleansed with mild soap and water. Do not cleanse the area with peroxide, alcohol, or iodine.  When you remove an adhesive bandage, be sure not to damage the skin around it.  If you have a wound, look at it several times a day to make sure it is healing.  Do not use heating pads or hot water bottles. They may burn your skin. If you have lost feeling in your feet or legs, you may not know it is happening until it is too late.  Make sure your health care provider performs a complete foot exam at least annually or more often if you have foot problems. Report any cuts, sores, or bruises to your health care provider immediately. SEEK MEDICAL CARE IF:   You have an injury that is not healing.  You have cuts or breaks in the skin.  You have an ingrown nail.  You notice redness on your legs or feet.  You feel burning or tingling in your legs or feet.  You have pain or cramps in your legs and feet.  Your legs or feet are numb.  Your feet always feel cold. SEEK IMMEDIATE MEDICAL CARE IF:   There is increasing redness,   swelling, or pain in or around a wound.  There is a red line that goes up your leg.  Pus is coming from a wound.  You develop a fever or as directed by your health care provider.  You notice a bad smell coming from an ulcer or wound. Document Released: 12/05/2000 Document Revised: 08/10/2013 Document Reviewed: 05/17/2013 ExitCare Patient Information 2014 [employer].  

## 2014-01-12 NOTE — Progress Notes (Signed)
   Subjective:    Patient ID: Paige Jarvis, female    DOB: [birth year], [age] y.o.   MRN: [id number]  HPI I am here to get my toenails cut and to get a refill of my medicine    Review of Systems  Constitutional: Negative.   HENT: Negative.   Eyes: Negative.   Respiratory: Negative.   Cardiovascular: Negative.   Gastrointestinal: Negative.   Endocrine: Positive for cold intolerance and heat intolerance.  Genitourinary: Negative.   Musculoskeletal: Positive for back pain.  Skin: Negative.   Allergic/Immunologic: Negative.   Neurological: Positive for numbness.  Hematological: Bruises/bleeds easily.  Psychiatric/Behavioral: Negative.        Objective:   Physical Exam Vascular status is intact with pedal pulses palpable DP +2/4 bilateral PT plus one over 4 bilateral Refill time 4 seconds all digits neurologically epicritic and proprioceptive sensations diminished on Semmes Weinstein testing to distal digits hallux and plantar forefoot. Normal plantar response DTRs are noted. Patient is needed paresthesias has been off for gabapentin for over a month ran out and did not contact us for refill. Her symptoms of neuritis or neuralgia exacerbated since she's not to gabapentin. We'll arthritis refill to gabapentin 3 mg 2 tablets a capsules twice a day. This time nails thick criptotic incurvated brittle and in growing painful and tender debrided and the presence of diabetes and complications as well       Assessment & Plan:  Assessment diabetes with peripheral neuropathy. Thick brittle painful mycotic nails with discoloration and brittle as tenderness in ingrowing are debrided at this time maintain followup in 3 months for continued palliative care maintain gabapentin as instructed contact us in changes or exacerbations occur. Patient maintaining appropriate diabetic type shoes at all times as well this  Alvan Dameichard Amun Stemm DPM

## 2014-04-13 ENCOUNTER — Ambulatory Visit: Payer: Medicaid Other

## 2014-05-11 ENCOUNTER — Ambulatory Visit: Payer: Medicaid Other

## 2014-06-08 ENCOUNTER — Ambulatory Visit: Payer: Medicaid Other

## 2014-07-13 ENCOUNTER — Ambulatory Visit: Payer: Medicaid Other

## 2014-08-10 ENCOUNTER — Ambulatory Visit (INDEPENDENT_AMBULATORY_CARE_PROVIDER_SITE_OTHER): Payer: Medicaid Other

## 2014-08-10 ENCOUNTER — Telehealth: Payer: Self-pay | Admitting: *Deleted

## 2014-08-10 VITALS — BP 108/65 | HR 62 | Resp 18

## 2014-08-10 DIAGNOSIS — B351 Tinea unguium: Secondary | ICD-10-CM

## 2014-08-10 DIAGNOSIS — M79609 Pain in unspecified limb: Secondary | ICD-10-CM

## 2014-08-10 DIAGNOSIS — E1142 Type 2 diabetes mellitus with diabetic polyneuropathy: Secondary | ICD-10-CM

## 2014-08-10 DIAGNOSIS — M79676 Pain in unspecified toe(s): Secondary | ICD-10-CM

## 2014-08-10 DIAGNOSIS — E1140 Type 2 diabetes mellitus with diabetic neuropathy, unspecified: Secondary | ICD-10-CM

## 2014-08-10 DIAGNOSIS — E1149 Type 2 diabetes mellitus with other diabetic neurological complication: Secondary | ICD-10-CM

## 2014-08-10 MED ORDER — GABAPENTIN 300 MG PO CAPS: ORAL_CAPSULE | ORAL | Status: DC

## 2014-08-10 NOTE — Patient Instructions (Signed)
Diabetes and Foot Care Diabetes may cause you to have problems because of poor blood supply (circulation) to your feet and legs. This may cause the skin on your feet to become thinner, break easier, and heal more slowly. Your skin may become dry, and the skin may peel and crack. You may also have nerve damage in your legs and feet causing decreased feeling in them. You may not notice minor injuries to your feet that could lead to infections or more serious problems. Taking care of your feet is one of the most important things you can do for yourself.  HOME CARE INSTRUCTIONS  Wear shoes at all times, even in the house. Do not go barefoot. Bare feet are easily injured.  Check your feet daily for blisters, cuts, and redness. If you cannot see the bottom of your feet, use a mirror or ask someone for help.  Wash your feet with warm water (do not use hot water) and mild soap. Then pat your feet and the areas between your toes until they are completely dry. Do not soak your feet as this can dry your skin.  Apply a moisturizing lotion or petroleum jelly (that does not contain alcohol and is unscented) to the skin on your feet and to dry, brittle toenails. Do not apply lotion between your toes.  Trim your toenails straight across. Do not dig under them or around the cuticle. File the edges of your nails with an emery board or nail file.  Do not cut corns or calluses or try to remove them with medicine.  Wear clean socks or stockings every day. Make sure they are not too tight. Do not wear knee-high stockings since they may decrease blood flow to your legs.  Wear shoes that fit properly and have enough cushioning. To break in new shoes, wear them for just a few hours a day. This prevents you from injuring your feet. Always look in your shoes before you put them on to be sure there are no objects inside.  Do not cross your legs. This may decrease the blood flow to your feet.  If you find a minor scrape,  cut, or break in the skin on your feet, keep it and the skin around it clean and dry. These areas may be cleansed with mild soap and water. Do not cleanse the area with peroxide, alcohol, or iodine.  When you remove an adhesive bandage, be sure not to damage the skin around it.  If you have a wound, look at it several times a day to make sure it is healing.  Do not use heating pads or hot water bottles. They may burn your skin. If you have lost feeling in your feet or legs, you may not know it is happening until it is too late.  Make sure your health care provider performs a complete foot exam at least annually or more often if you have foot problems. Report any cuts, sores, or bruises to your health care provider immediately. SEEK MEDICAL CARE IF:   You have an injury that is not healing.  You have cuts or breaks in the skin.  You have an ingrown nail.  You notice redness on your legs or feet.  You feel burning or tingling in your legs or feet.  You have pain or cramps in your legs and feet.  Your legs or feet are numb.  Your feet always feel cold. SEEK IMMEDIATE MEDICAL CARE IF:   There is increasing redness,   swelling, or pain in or around a wound.  There is a red line that goes up your leg.  Pus is coming from a wound.  You develop a fever or as directed by your health care provider.  You notice a bad smell coming from an ulcer or wound. Document Released: 12/05/2000 Document Revised: 08/10/2013 Document Reviewed: 05/17/2013 ExitCare Patient Information 2015 [employer]. This information is not intended to replace advice given to you by your health care provider. Make sure you discuss any questions you have with your health care provider.  

## 2014-08-10 NOTE — Progress Notes (Signed)
   Subjective:    Patient ID: Paige SprayAnnette O Bohnsack, female    DOB: 1966-10-20, 48 y.o.   MRN: [id number]  HPI I AM HERE TO GET MY NAILS TRIMMED AND TO GET A REFILL OF MY MEDICINES    Review of Systems no new findings or systemic changes noted. Patient is having more arthritis difficulties with her knees and even more difficult time with gait. I did make recommendation to consider water therapy or [facility] exercises [facility] or an available public pool    Objective:   Physical Exam Lower extremity objective findings reveal vascular status intact DP and PT one over 4 bilateral, mild edema noted there is some atrophy the legs patient not been as active temperature warm to cool turgor diminished neurologically diminished epicritic sensation on Semmes Weinstein testing DTRs not elicited normal plantar response noted nails thick criptotic incurvated with tenderness and pain on ambulation and on debridement. Patient is already on gabapentin for her neuropathy and is dealing with her difficulty using a walker for gait times.       Assessment & Plan:  Assessment this time in his diabetes with history peripheral neuropathy and angiopathy. Has significant arthritis and gait difficulties noted nails thick criptotic incurvated and brittle one through 5 bilateral debridement at this time following debridement the first left history with lumicain and Neosporin recheck in 3 months for followup continued palliative care in the future as needed  Alvan Dameichard Etienne Millward DPM

## 2014-08-10 NOTE — Addendum Note (Signed)
Addended by: Alvan DameSIKORA, Koree Staheli on: 08/10/2014 06:22 PM   Modules accepted: Orders

## 2014-08-10 NOTE — Telephone Encounter (Signed)
DR Ralene CorkSIKORA IS TAKEN CARE OF THE RX AND GOING TO SEND IT TO [facility]. LISA

## 2014-11-08 ENCOUNTER — Ambulatory Visit: Payer: Medicaid Other

## 2014-12-01 ENCOUNTER — Ambulatory Visit: Payer: Medicaid Other

## 2014-12-04 ENCOUNTER — Ambulatory Visit: Payer: Medicaid Other

## 2014-12-07 ENCOUNTER — Encounter: Payer: Self-pay | Admitting: *Deleted

## 2014-12-08 ENCOUNTER — Ambulatory Visit: Payer: Medicaid Other

## 2016-03-31 ENCOUNTER — Ambulatory Visit: Payer: Medicaid Other | Admitting: Podiatry

## 2020-07-07 ENCOUNTER — Encounter (HOSPITAL_COMMUNITY): Payer: Self-pay | Admitting: *Deleted

## 2020-07-07 ENCOUNTER — Emergency Department (HOSPITAL_COMMUNITY)
Admission: EM | Admit: 2020-07-07 | Discharge: 2020-07-07 | Disposition: A | Discharge status: Home / Self Care | Payer: Medicaid Other | Attending: Emergency Medicine | Admitting: Emergency Medicine

## 2020-07-07 ENCOUNTER — Other Ambulatory Visit: Payer: Self-pay

## 2020-07-07 DIAGNOSIS — G629 Polyneuropathy, unspecified: Secondary | ICD-10-CM | POA: Insufficient documentation

## 2020-07-07 DIAGNOSIS — J449 Chronic obstructive pulmonary disease, unspecified: Secondary | ICD-10-CM | POA: Insufficient documentation

## 2020-07-07 DIAGNOSIS — R52 Pain, unspecified: Secondary | ICD-10-CM | POA: Diagnosis present

## 2020-07-07 DIAGNOSIS — Z7982 Long term (current) use of aspirin: Secondary | ICD-10-CM | POA: Insufficient documentation

## 2020-07-07 DIAGNOSIS — Z7984 Long term (current) use of oral hypoglycemic drugs: Secondary | ICD-10-CM | POA: Diagnosis not present

## 2020-07-07 DIAGNOSIS — E876 Hypokalemia: Secondary | ICD-10-CM | POA: Diagnosis not present

## 2020-07-07 DIAGNOSIS — E119 Type 2 diabetes mellitus without complications: Secondary | ICD-10-CM | POA: Insufficient documentation

## 2020-07-07 DIAGNOSIS — R944 Abnormal results of kidney function studies: Secondary | ICD-10-CM | POA: Diagnosis not present

## 2020-07-07 DIAGNOSIS — Z79899 Other long term (current) drug therapy: Secondary | ICD-10-CM | POA: Insufficient documentation

## 2020-07-07 DIAGNOSIS — R7989 Other specified abnormal findings of blood chemistry: Secondary | ICD-10-CM

## 2020-07-07 DIAGNOSIS — N390 Urinary tract infection, site not specified: Secondary | ICD-10-CM

## 2020-07-07 LAB — COMPREHENSIVE METABOLIC PANEL
ALT: 21 U/L (ref 0–44)
AST: 21 U/L (ref 15–41)
Albumin: 3.4 g/dL — ABNORMAL LOW (ref 3.5–5.0)
Alkaline Phosphatase: 92 U/L (ref 38–126)
Anion gap: 16 — ABNORMAL HIGH (ref 5–15)
BUN: 27 mg/dL — ABNORMAL HIGH (ref 6–20)
CO2: 21 mmol/L — ABNORMAL LOW (ref 22–32)
Calcium: 9.2 mg/dL (ref 8.9–10.3)
Chloride: 95 mmol/L — ABNORMAL LOW (ref 98–111)
Creatinine, Ser: 1.78 mg/dL — ABNORMAL HIGH (ref 0.44–1.00)
GFR calc Af Amer: 37 mL/min — ABNORMAL LOW (ref >60)
GFR calc non Af Amer: 32 mL/min — ABNORMAL LOW (ref >60)
Glucose, Bld: 159 mg/dL — ABNORMAL HIGH (ref 70–99)
Potassium: 3.1 mmol/L — ABNORMAL LOW (ref 3.5–5.1)
Sodium: 132 mmol/L — ABNORMAL LOW (ref 135–145)
Total Bilirubin: 0.9 mg/dL (ref 0.3–1.2)
Total Protein: 8.1 g/dL (ref 6.5–8.1)

## 2020-07-07 LAB — URINALYSIS, ROUTINE W REFLEX MICROSCOPIC
Bilirubin Urine: NEGATIVE
Glucose, UA: >=500 mg/dL — AB
Hgb urine dipstick: NEGATIVE
Ketones, ur: NEGATIVE mg/dL
Nitrite: POSITIVE — AB
Protein, ur: NEGATIVE mg/dL
Specific Gravity, Urine: 1.005 (ref 1.005–1.030)
pH: 5 (ref 5.0–8.0)

## 2020-07-07 LAB — CBC
HCT: 39.5 % (ref 36.0–46.0)
Hemoglobin: 13.3 g/dL (ref 12.0–15.0)
MCH: 27.4 pg (ref 26.0–34.0)
MCHC: 33.7 g/dL (ref 30.0–36.0)
MCV: 81.3 fL (ref 80.0–100.0)
Platelets: 400 10*3/uL (ref 150–400)
RBC: 4.86 MIL/uL (ref 3.87–5.11)
RDW: 13.9 % (ref 11.5–15.5)
WBC: 15.8 10*3/uL — ABNORMAL HIGH (ref 4.0–10.5)
nRBC: 0.2 % (ref 0.0–0.2)

## 2020-07-07 LAB — I-STAT BETA HCG BLOOD, ED (MC, WL, AP ONLY): I-stat hCG, quantitative: <5 m[IU]/mL (ref <5)

## 2020-07-07 MED ORDER — SODIUM CHLORIDE 0.9 % IV SOLN
1.0000 g | Freq: Once | INTRAVENOUS | Status: AC
  Administered 2020-07-07: 1 g via INTRAVENOUS
  Filled 2020-07-07: qty 10

## 2020-07-07 MED ORDER — POTASSIUM CHLORIDE CRYS ER 20 MEQ PO TBCR
40.0000 meq | EXTENDED_RELEASE_TABLET | Freq: Once | ORAL | Status: AC
  Administered 2020-07-07: 40 meq via ORAL
  Filled 2020-07-07: qty 2

## 2020-07-07 MED ORDER — SODIUM CHLORIDE 0.9 % IV BOLUS
1000.0000 mL | Freq: Once | INTRAVENOUS | Status: AC
  Administered 2020-07-07: 1000 mL via INTRAVENOUS

## 2020-07-07 MED ORDER — CEPHALEXIN 500 MG PO CAPS
500.0000 mg | ORAL_CAPSULE | Freq: Four times a day (QID) | ORAL | 0 refills | Status: DC
Start: 2020-07-07 — End: 2021-07-16

## 2020-07-07 MED ORDER — SODIUM CHLORIDE 0.9% FLUSH: 3.0000 mL | Freq: Once | INTRAVENOUS | Status: DC

## 2020-07-07 MED ORDER — DIPHENHYDRAMINE HCL 50 MG/ML IJ SOLN
25.0000 mg | Freq: Once | INTRAMUSCULAR | Status: AC
  Administered 2020-07-07: 25 mg via INTRAVENOUS
  Filled 2020-07-07: qty 1

## 2020-07-07 MED ORDER — METOCLOPRAMIDE HCL 5 MG/ML IJ SOLN
10.0000 mg | Freq: Once | INTRAMUSCULAR | Status: AC
  Administered 2020-07-07: 10 mg via INTRAVENOUS
  Filled 2020-07-07: qty 2

## 2020-07-07 MED ORDER — GABAPENTIN 300 MG PO CAPS
300.0000 mg | ORAL_CAPSULE | Freq: Once | ORAL | Status: AC
  Administered 2020-07-07: 300 mg via ORAL
  Filled 2020-07-07: qty 1

## 2020-07-07 NOTE — ED Triage Notes (Signed)
The pt arrived by [facility] the pt is c/o pain and numbness all over her body since wednesday

## 2020-07-07 NOTE — ED Provider Notes (Signed)
[facility] EMERGENCY DEPARTMENT Provider Note   CSN: [id number] Arrival date & time: 07/07/20  1504     History Chief Complaint  Patient presents with  . Generalized Body Aches    Paige Jarvis is a 54 y.o. female.  Paige Jarvis is a 54 y.o. female with a history of diabetes, neuropathy, hypertension, GERD, COPD, obesity, arthritis, chronic pain, PTSD, frequent headaches, who presents to the ED via [facility] complaining of pain and numbness all over her body since Wednesday.  She states that she has pain and a burning and tingling sensation in bilateral hands and feet as well as her face and she reports that she has diffuse pain all over the left side of her body.  She also complains of a headache.  She states that she frequently feels this way when she gets stressed, and was previously on Valium for this.  She is also on 600 mg 3 times daily of gabapentin for neuropathy but feels like she has not been getting much benefit from this.  She denies any fevers or chills.  No chest pain or shortness of breath.  No lightheadedness or syncope.  No weakness and she reports only numbness and tingling in the typical distribution of her neuropathy in her hands and feet.  No abdominal pain, nausea or vomiting.  She states that her primary care doctor and [facility] recently changed her diabetes medications because of some worsening kidney function and she states that she has been having some higher blood sugars, last night her blood sugar was 400, she has been taking her medications as per usual, is not on insulin at this time.  No other aggravating or alleviating factors.        Past Medical History:  Diagnosis Date  . Arthritis   . Asthma   . Bladder problem   . Calf pain    when walking  . Chest pain   . COPD (chronic obstructive pulmonary disease) (HCC)   . Diabetes mellitus without complication (HCC)   . Dizziness   . Fatigue   . Fever   . Frequent headaches   .  GERD (gastroesophageal reflux disease)   . Heart murmur   . Muscle pain   . Night sweats   . Poor circulation   . PTSD (post-traumatic stress disorder)   . Sinus problem   . Swelling   . Weight decrease     Patient Active Problem List   Diagnosis Date Noted  . TRIGGER FINGER 03/14/2010    Past Surgical History:  Procedure Laterality Date  . gall stone surgery    . GALLBLADDER SURGERY    . intestional surgery     blockage  . spleen surgery       OB History   No obstetric history on file.     No family history on file.  Social History   Tobacco Use  . Smoking status: Never Smoker  . Smokeless tobacco: Never Used  Substance Use Topics  . Alcohol use: No  . Drug use: No    Home Medications Prior to Admission medications   Medication Sig Start Date End Date Taking? Authorizing Provider  aspirin 81 MG tablet Take 81 mg by mouth daily.    [provider]  atenolol-chlorthalidone (TENORETIC) 50-25 MG per tablet Take 1 tablet by mouth daily.    [provider]  gabapentin (NEURONTIN) 300 MG capsule Take 2 capsules by mouth twice daily 08/10/14   Ralene Cork,  Richard, DPM  HYDROcodone-acetaminophen (NORCO/VICODIN) 5-325 MG per tablet Take 1 tablet by mouth every 6 (six) hours as needed for moderate pain.    [provider]  potassium chloride SA (K-DUR,KLOR-CON) 20 MEQ tablet Take 20 mEq by mouth 2 (two) times daily.    [provider]  simvastatin (ZOCOR) 40 MG tablet Take 40 mg by mouth daily.    [provider]  sitaGLIPtin (JANUVIA) 25 MG tablet Take 25 mg by mouth daily. Not sure of the mg/lc    [provider]  Vitamin D, Ergocalciferol, (DRISDOL) 50000 UNITS CAPS capsule Take 50,000 Units by mouth every 7 (seven) days. Patient is taken vit d2 1.25 mg/lc    [provider]    Allergies    Penicillins  Review of Systems   Review of Systems  Constitutional: Negative for chills and fever.  HENT: Negative.     Eyes: Negative for visual disturbance.  Respiratory: Negative for cough and shortness of breath.   Cardiovascular: Negative for chest pain.  Gastrointestinal: Negative for abdominal pain, diarrhea, nausea and vomiting.  Genitourinary: Negative for dysuria, flank pain and frequency.  Musculoskeletal: Positive for arthralgias, back pain and myalgias.  Skin: Negative for color change and rash.  Neurological: Positive for numbness and headaches. Negative for dizziness, tremors, seizures, syncope, facial asymmetry, speech difficulty, weakness and light-headedness.    Physical Exam Updated Vital Signs BP 128/71   Pulse 83   Temp 98.6 F (37 C) (Oral)   Resp 16   Ht 5\' 7"  (1.702 m)   Wt 108.9 kg   SpO2 94%   BMI 37.59 kg/m   Physical Exam Vitals and nursing note reviewed.  Constitutional:      General: She is not in acute distress.    Appearance: Normal appearance. She is well-developed. She is obese. She is not ill-appearing or diaphoretic.     Comments: Well-appearing and in no distress  HENT:     Head: Normocephalic and atraumatic.     Mouth/Throat:     Mouth: Mucous membranes are moist.     Pharynx: Oropharynx is clear.  Eyes:     General:        Right eye: No discharge.        Left eye: No discharge.     Pupils: Pupils are equal, round, and reactive to light.  Cardiovascular:     Rate and Rhythm: Normal rate and regular rhythm.     Heart sounds: Normal heart sounds. No murmur heard.  No friction rub. No gallop.   Pulmonary:     Effort: Pulmonary effort is normal. No respiratory distress.     Breath sounds: Normal breath sounds. No wheezing or rales.     Comments: Respirations equal and unlabored, patient able to speak in full sentences, lungs clear to auscultation bilaterally Abdominal:     General: Bowel sounds are normal. There is no distension.     Palpations: Abdomen is soft. There is no mass.     Tenderness: There is no abdominal tenderness. There is no  guarding.     Comments: Abdomen soft, nondistended, nontender to palpation in all quadrants without guarding or peritoneal signs  Musculoskeletal:        General: No deformity.     Cervical back: Neck supple.     Comments: No midline spinal tenderness, all compartments soft in all joints supple and easily movable  Skin:    General: Skin is warm and dry.     Capillary  Refill: Capillary refill takes less than 2 seconds.  Neurological:     Mental Status: She is alert.     Coordination: Coordination normal.     Comments: Speech is clear, able to follow commands CN III-XII intact Normal strength in upper and lower extremities bilaterally including dorsiflexion and plantar flexion, strong and equal grip strength Decreased sensation reported in bilateral hands and feet but otherwise sensation intact Moves extremities without ataxia, coordination intact  Psychiatric:        Mood and Affect: Mood normal.        Behavior: Behavior normal.     ED Results / Procedures / Treatments   Labs (all labs ordered are listed, but only abnormal results are displayed) Labs Reviewed  CBC - Abnormal; Notable for the following components:      Result Value   WBC 15.8 (*)    All other components within normal limits  COMPREHENSIVE METABOLIC PANEL  URINALYSIS, ROUTINE W REFLEX MICROSCOPIC  I-STAT BETA HCG BLOOD, ED ([facility], [facility], [facility] ONLY)    EKG EKG Interpretation  Date/Time:  Saturday July 07 2020 16:34:40 EDT Ventricular Rate:  74 PR Interval:    QRS Duration: 147 QT Interval:  434 QTC Calculation: 482 R Axis:   -49 Text Interpretation: Sinus rhythm RBBB and LAFB Left ventricular hypertrophy rbbb and lafb new from first prior ekg of 2009 Confirmed by Margarita Grizzleay, Danielle [id number]) on 07/07/2020 8:04:35 PM   Radiology No results found.  Procedures Procedures (including critical care time)  Medications Ordered in ED Medications  sodium chloride flush (NS) 0.9 % injection 3 mL (3 mLs Intravenous Not  Given 07/07/20 1611)    ED Course  I have reviewed the triage vital signs and the nursing notes.  Pertinent labs & imaging results that were available during my care of the patient were reviewed by me and considered in my medical decision making (see chart for details).    MDM Rules/Calculators/A&P                          54 year old female presents with pain in bilateral hands and feet, face and reporting some pain over the left side of her body.  Reports it feels like burning and pins-and-needles in her hands and feet, she does have a history of neuropathy but feels like she is not getting much benefit from her gabapentin, she states when she gets stressed this tends to get worse and she also develops a headache which she complains of today.  Aside from decreased sensation in her hands and feet she does not have any other focal neurologic deficits.  Aside from some pain over the left side of her body she denies focal complaints no chest pain or abdominal pain, no recent fevers.  She does report frequent urinary tract infections and has had some pain over her kidney, did not have any CVA tenderness on exam.  Will check basic labs, EKG and urinalysis.  Will give headache cocktail, patient is currently on 600 of gabapentin 3 times daily, will give additional 300 mg of gabapentin.  I have independently ordered, reviewed and interpreted all labs: CBC: Leukocytosis of 15.8, patient is afebrile with normal vitals CMP: Creatinine of 1.78, was previously 1.0, patient does report that her doctor recently mentioned worsening kidney function so I suspect this is more chronic, BUN of 27, mild hypokalemia of 3.1, p.o. potassium given, CO2 of 21 with anion gap of 16, suspect some dehydration, patient  given IV fluids.  Normal hepatic function UA: Positive nitrites with trace leukocytes and some bacteria present concerning for infection, will send for culture we will treat with a dose of IV Rocephin and then sent  home on Keflex to cover for any kidney involvement given some reported left flank pain, no CVA tenderness on exam today  Patient's vitals have remained normal she is feeling much improved after medications given here in the ED, does not appear to be septic and I suspect a lot of her pain is from neuropathy.  Will increase her home gabapentin and have her follow closely with her PCP, she has an appointment scheduled on Monday, she should also discuss continued diabetes care.  Her sugar is mildly elevated today at 159.  Patient discharged with antibiotics and stressed the importance of continuing this and having her urine rechecked on Monday.  She expresses understanding and agreement, discharged home in good condition.  Final Clinical Impression(s) / ED Diagnoses Final diagnoses:  Neuropathy  Acute UTI  Hypokalemia  Elevated serum creatinine    Rx / DC Orders ED Discharge Orders         Ordered    cephALEXin (KEFLEX) 500 MG capsule  4 times daily     Discontinue  Reprint     07/07/20 1946           Legrand Rams 07/07/20 2007    Margarita Grizzle, MD 07/08/20 0000

## 2020-07-07 NOTE — Discharge Instructions (Signed)
I suspect a lot of your pain is due to neuropathy, you can take 900 mg 3 times daily of your gabapentin to help with this, you got more relief here in the ED with this dose today.  Your urine does show that you have an infection, please take antibiotics 4 times daily as directed.  Make sure you are taking all of your diabetes medications regularly as well.  Go to your scheduled follow-up appointment with your primary care doctor on Monday.  Return to the emergency department if you have fevers or other new or worsening symptoms.  Your potassium was low today and your kidney function was elevated you need to have these rechecked with your primary doctor on Monday.

## 2020-07-10 LAB — URINE CULTURE: Culture: >=100000 — AB

## 2020-07-12 ENCOUNTER — Telehealth: Payer: Self-pay | Admitting: *Deleted

## 2020-07-12 NOTE — Telephone Encounter (Signed)
Post ED Visit - Positive Culture Follow-up  Culture report reviewed by antimicrobial stewardship pharmacist: [facility] []  , Pharm.D. []  Enzo Bi, Pharm.D., BCPS AQ-ID []  , Pharm.D., BCPS []  Celedonio Miyamoto, Pharm.D., BCPS []  Edith Endave, Garvin Fila.D., BCPS, AAHIVP []  , Pharm.D., BCPS, AAHIVP []  Georgina Pillion, PharmD, BCPS []  , PharmD, BCPS []  [place], PharmD, BCPS []  [address], PharmD []  , PharmD, BCPS []  Estella Husk, PharmD  Pharmacy Team []  Lysle Pearl, PharmD []  , PharmD []  Phillips Climes, PharmD []  , Rph []  Agapito Games) , PharmD []  Verlan Friends, PharmD []  , PharmD []  Mervyn Gay, PharmD []  , PharmD []  Vinnie Level, PharmD []  [facility], PharmD []  , PharmD []  Len Childs, PharmD   Positive urine culture Treated with Cephalexin, organism sensitive to the same and no further patient follow-up is required at this time. , PharmD  Greer Pickerel Talley 07/12/2020, 10:51 AM

## 2021-07-16 ENCOUNTER — Encounter: Payer: Self-pay | Admitting: *Deleted

## 2021-07-16 ENCOUNTER — Encounter: Payer: Self-pay | Admitting: Cardiology

## 2021-07-16 DIAGNOSIS — J302 Other seasonal allergic rhinitis: Secondary | ICD-10-CM | POA: Insufficient documentation

## 2021-07-16 DIAGNOSIS — J449 Chronic obstructive pulmonary disease, unspecified: Secondary | ICD-10-CM | POA: Insufficient documentation

## 2021-07-16 DIAGNOSIS — E782 Mixed hyperlipidemia: Secondary | ICD-10-CM | POA: Insufficient documentation

## 2021-07-16 DIAGNOSIS — E039 Hypothyroidism, unspecified: Secondary | ICD-10-CM | POA: Insufficient documentation

## 2021-07-16 DIAGNOSIS — I10 Essential (primary) hypertension: Secondary | ICD-10-CM | POA: Insufficient documentation

## 2021-07-16 DIAGNOSIS — G4733 Obstructive sleep apnea (adult) (pediatric): Secondary | ICD-10-CM | POA: Insufficient documentation

## 2021-07-16 DIAGNOSIS — F330 Major depressive disorder, recurrent, mild: Secondary | ICD-10-CM | POA: Insufficient documentation

## 2021-07-16 DIAGNOSIS — E119 Type 2 diabetes mellitus without complications: Secondary | ICD-10-CM | POA: Insufficient documentation

## 2021-07-16 DIAGNOSIS — I350 Nonrheumatic aortic (valve) stenosis: Secondary | ICD-10-CM | POA: Insufficient documentation

## 2021-07-16 DIAGNOSIS — M1990 Unspecified osteoarthritis, unspecified site: Secondary | ICD-10-CM | POA: Insufficient documentation

## 2021-07-16 DIAGNOSIS — M0529 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: Secondary | ICD-10-CM | POA: Insufficient documentation

## 2021-07-16 DIAGNOSIS — F4310 Post-traumatic stress disorder, unspecified: Secondary | ICD-10-CM | POA: Insufficient documentation

## 2021-07-16 DIAGNOSIS — K219 Gastro-esophageal reflux disease without esophagitis: Secondary | ICD-10-CM

## 2021-07-16 DIAGNOSIS — J45909 Unspecified asthma, uncomplicated: Secondary | ICD-10-CM | POA: Insufficient documentation

## 2021-07-16 HISTORY — DX: Gastro-esophageal reflux disease without esophagitis: K21.9

## 2021-08-22 ENCOUNTER — Other Ambulatory Visit: Payer: Self-pay

## 2021-08-22 DIAGNOSIS — L03116 Cellulitis of left lower limb: Secondary | ICD-10-CM | POA: Insufficient documentation

## 2021-08-22 DIAGNOSIS — R296 Repeated falls: Secondary | ICD-10-CM | POA: Insufficient documentation

## 2021-08-22 DIAGNOSIS — R5383 Other fatigue: Secondary | ICD-10-CM | POA: Insufficient documentation

## 2021-08-22 DIAGNOSIS — L209 Atopic dermatitis, unspecified: Secondary | ICD-10-CM | POA: Insufficient documentation

## 2021-08-22 DIAGNOSIS — R519 Headache, unspecified: Secondary | ICD-10-CM | POA: Insufficient documentation

## 2021-08-22 DIAGNOSIS — E669 Obesity, unspecified: Secondary | ICD-10-CM | POA: Insufficient documentation

## 2021-08-22 DIAGNOSIS — R609 Edema, unspecified: Secondary | ICD-10-CM | POA: Insufficient documentation

## 2021-08-22 DIAGNOSIS — R509 Fever, unspecified: Secondary | ICD-10-CM | POA: Insufficient documentation

## 2021-08-22 DIAGNOSIS — M549 Dorsalgia, unspecified: Secondary | ICD-10-CM | POA: Insufficient documentation

## 2021-08-22 DIAGNOSIS — D539 Nutritional anemia, unspecified: Secondary | ICD-10-CM | POA: Insufficient documentation

## 2021-08-22 DIAGNOSIS — M79669 Pain in unspecified lower leg: Secondary | ICD-10-CM | POA: Insufficient documentation

## 2021-08-22 DIAGNOSIS — F419 Anxiety disorder, unspecified: Secondary | ICD-10-CM | POA: Insufficient documentation

## 2021-08-22 DIAGNOSIS — M5441 Lumbago with sciatica, right side: Secondary | ICD-10-CM | POA: Insufficient documentation

## 2021-08-22 DIAGNOSIS — R011 Cardiac murmur, unspecified: Secondary | ICD-10-CM | POA: Insufficient documentation

## 2021-08-22 DIAGNOSIS — M542 Cervicalgia: Secondary | ICD-10-CM | POA: Insufficient documentation

## 2021-08-22 DIAGNOSIS — Z9289 Personal history of other medical treatment: Secondary | ICD-10-CM | POA: Insufficient documentation

## 2021-08-22 DIAGNOSIS — R634 Abnormal weight loss: Secondary | ICD-10-CM | POA: Insufficient documentation

## 2021-08-22 DIAGNOSIS — N329 Bladder disorder, unspecified: Secondary | ICD-10-CM | POA: Insufficient documentation

## 2021-08-22 DIAGNOSIS — J349 Unspecified disorder of nose and nasal sinuses: Secondary | ICD-10-CM | POA: Insufficient documentation

## 2021-08-22 DIAGNOSIS — D519 Vitamin B12 deficiency anemia, unspecified: Secondary | ICD-10-CM | POA: Insufficient documentation

## 2021-08-22 DIAGNOSIS — R42 Dizziness and giddiness: Secondary | ICD-10-CM | POA: Insufficient documentation

## 2021-08-22 DIAGNOSIS — M791 Myalgia, unspecified site: Secondary | ICD-10-CM | POA: Insufficient documentation

## 2021-08-22 DIAGNOSIS — R0989 Other specified symptoms and signs involving the circulatory and respiratory systems: Secondary | ICD-10-CM | POA: Insufficient documentation

## 2021-08-22 DIAGNOSIS — L02811 Cutaneous abscess of head [any part, except face]: Secondary | ICD-10-CM | POA: Insufficient documentation

## 2021-08-22 DIAGNOSIS — E559 Vitamin D deficiency, unspecified: Secondary | ICD-10-CM | POA: Insufficient documentation

## 2021-08-22 DIAGNOSIS — R61 Generalized hyperhidrosis: Secondary | ICD-10-CM | POA: Insufficient documentation

## 2021-08-22 DIAGNOSIS — R079 Chest pain, unspecified: Secondary | ICD-10-CM | POA: Insufficient documentation

## 2021-08-22 DIAGNOSIS — Z79891 Long term (current) use of opiate analgesic: Secondary | ICD-10-CM | POA: Insufficient documentation

## 2021-08-27 ENCOUNTER — Ambulatory Visit (INDEPENDENT_AMBULATORY_CARE_PROVIDER_SITE_OTHER): Payer: Medicaid Other | Admitting: Cardiology

## 2021-08-27 ENCOUNTER — Encounter: Payer: Self-pay | Admitting: Cardiology

## 2021-08-27 ENCOUNTER — Other Ambulatory Visit: Payer: Self-pay

## 2021-08-27 VITALS — BP 160/90 | HR 84 | Ht 66.0 in | Wt 297.2 lb

## 2021-08-27 DIAGNOSIS — R079 Chest pain, unspecified: Secondary | ICD-10-CM

## 2021-08-27 DIAGNOSIS — I10 Essential (primary) hypertension: Secondary | ICD-10-CM

## 2021-08-27 DIAGNOSIS — R072 Precordial pain: Secondary | ICD-10-CM

## 2021-08-27 DIAGNOSIS — E782 Mixed hyperlipidemia: Secondary | ICD-10-CM

## 2021-08-27 DIAGNOSIS — E088 Diabetes mellitus due to underlying condition with unspecified complications: Secondary | ICD-10-CM

## 2021-08-27 DIAGNOSIS — I350 Nonrheumatic aortic (valve) stenosis: Secondary | ICD-10-CM | POA: Insufficient documentation

## 2021-08-27 DIAGNOSIS — G4733 Obstructive sleep apnea (adult) (pediatric): Secondary | ICD-10-CM

## 2021-08-27 HISTORY — DX: Nonrheumatic aortic (valve) stenosis: I35.0

## 2021-08-27 HISTORY — DX: Diabetes mellitus due to underlying condition with unspecified complications: E08.8

## 2021-08-27 HISTORY — DX: Morbid (severe) obesity due to excess calories: E66.01

## 2021-08-27 HISTORY — DX: Chest pain, unspecified: R07.9

## 2021-08-27 MED ORDER — NITROGLYCERIN 0.4 MG SL SUBL
0.4000 mg | SUBLINGUAL_TABLET | SUBLINGUAL | 6 refills | Status: DC | PRN
Start: 2021-08-27 — End: 2023-07-29

## 2021-08-27 NOTE — Progress Notes (Signed)
Cardiology Office Note:    Date:  08/27/2021   ID:  Paige Jarvis, DOB December 08, 1966, MRN [id number]  PCP:  Hortencia Conradi, NP  Cardiologist:  Garwin Brothers, MD   Referring MD: Galvin Proffer, MD    ASSESSMENT:    1. Essential hypertension   2. Chest pain of uncertain etiology   3. Mild aortic stenosis   4. Mixed hyperlipidemia   5. OSA (obstructive sleep apnea)   6. Morbid obesity (HCC)   7. Diabetes mellitus due to underlying condition with unspecified complications (HCC)    PLAN:    In order of problems listed above:  Primary prevention stressed with the patient.  Importance of compliance with diet patient stressed and she vocalized understanding. Chest pain: Patient gives history of chest pain which is atypical in nature.  However in view of risk factors we will do a Lexiscan sestamibi.  Sublingual nitroglycerin prescription was sent, its protocol and 911 protocol explained and the patient vocalized understanding questions were answered to the patient's satisfaction Mild aortic stenosis: Stable and asymptomatic.  We will monitor.  Diagnosis discussed with her by echocardiogram that was reviewed from Dr.'s office. Diabetes mellitus, mixed dyslipidemia and obesity: Weight reduction stressed.  Lifestyle modification urged and the risks of obesity explained and she promises to do better. Patient will be seen in follow-up appointment in 6 months or earlier if the patient has any concerns    Medication Adjustments/Labs and Tests Ordered: Current medicines are reviewed at length with the patient today.  Concerns regarding medicines are outlined above.  No orders of the defined types were placed in this encounter.  No orders of the defined types were placed in this encounter.    History of Present Illness:    Paige Jarvis is a 55 y.o. female who is being seen today for the evaluation of chest pain and aortic stenosis at the request of Hague, Myrene Galas, MD. patient is a  pleasant 55 year old female.  She has past medical history of diabetes mellitus on insulin, essential hypertension, dyslipidemia and diabetes mellitus she has morbid obesity.  She leads a sedentary lifestyle because of orthopedic issues.  No chest pain orthopnea or PND.  At the time of my evaluation, the patient is alert awake oriented and in no distress.  Patient mentions to me that she has chest pain at times and is not related to exertion.  She does not do much in terms of exercise.  This is because of obesity and knee issues related to orthopedic concerns.  She also is not sexually active.  She mentions to me that her chest pain sometimes is in the shoulder and sometimes in the back and she is not related to exertion.  No radiation to any part of the body.  Past Medical History:  Diagnosis Date   Anxiety disorder    Arthritis    Asthma    Atopic dermatitis    B12 deficiency anemia    Back pain    Bladder problem    Calf pain    when walking   Cellulitis of left lower limb    Cervicalgia    Chest pain    COPD (chronic obstructive pulmonary disease) (HCC)    Cutaneous abscess of head (any part, except face)    Dizziness    Essential hypertension    Fatigue    Fever    Frequent headaches    GERD (gastroesophageal reflux disease)    Heart murmur  History of blood transfusion    Hypothyroidism    Long term (current) use of opiate analgesic    Lumbago with sciatica, right side    Major depressive disorder, recurrent, mild (HCC)    Mixed hyperlipidemia    Muscle pain    Night sweats    Nonrheumatic aortic (valve) stenosis    Obesity    OSA (obstructive sleep apnea)    CPAP   Poor circulation    PTSD (post-traumatic stress disorder)    Repeated falls    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   aspirin EC 81 MG tablet Take 81 mg by mouth daily with breakfast. Swallow whole.   atenolol-chlorthalidone (TENORETIC) 50-25 MG per tablet Take 1 tablet by mouth daily.   atorvastatin (LIPITOR) 40 MG tablet Take 40 mg by mouth daily.   Budeson-Glycopyrrol-Formoterol (BREZTRI AEROSPHERE) 160-9-4.8 MCG/ACT AERO Inhale 2 puffs into the lungs 2 (two) times daily as needed for shortness of breath (shortness of breath).   busPIRone (BUSPAR) 15 MG tablet Take 15 mg by mouth 2 (two) times daily.   cetirizine (ZYRTEC) 10 MG tablet Take 10 mg by mouth daily.   fluticasone (FLONASE) 50 MCG/ACT nasal spray Place 1 spray into both nostrils 2 (two) times daily.   furosemide (LASIX) 40 MG tablet Take 60 mg by mouth 2 (two) times daily.   gabapentin (NEURONTIN) 600 MG tablet Take 600 mg by mouth 3 (three) times daily.   hydrOXYzine (ATARAX/VISTARIL) 50 MG tablet Take 50 mg by mouth every 8 (eight) hours as needed for itching.   insulin aspart (FIASP FLEXTOUCH) 100 UNIT/ML FlexTouch Pen Inject 30 Units into the skin 3 (three) times daily. Per sliding scale, max of 30 units   insulin degludec (TRESIBA FLEXTOUCH) 200 UNIT/ML FlexTouch Pen Inject 60 Units into the skin at bedtime.   INVOKANA 300 MG TABS tablet Take 300 mg by mouth daily.   omeprazole (PRILOSEC) 20 MG capsule Take 20 mg by mouth daily before breakfast.   ondansetron (ZOFRAN) 8 MG tablet Take 8 mg by mouth every 12 (twelve) hours as needed for nausea or vomiting.   oxyCODONE-acetaminophen (PERCOCET) 10-325 MG tablet Take 1 tablet by mouth  every 8 (eight) hours as needed for pain.   potassium chloride SA (K-DUR,KLOR-CON) 20 MEQ tablet Take 20 mEq by mouth in the morning and at bedtime.    TRULICITY 0.75 MG/0.5ML SOPN Inject 0.75 mg into the skin once a week.   Vitamin D, Ergocalciferol, (DRISDOL) 1.25 MG (50000 UNIT) CAPS capsule Take 50,000 Units by mouth 3 (three) times a week.     Allergies:   Hydroxyzine hcl, Penicillins, and Sertraline   Social History   Socioeconomic History   Marital status: Widowed    Spouse name: Not on file   Number of  children: Not on file   Years of education: Not on file   Highest education level: Not on file  Occupational History   Not on file  Tobacco Use   Smoking status: Every Day    Types: Cigarettes   Smokeless tobacco: Never  Substance and Sexual Activity   Alcohol use: No   Drug use: No   Sexual activity: Not on file  Other Topics Concern   Not on file  Social History Narrative   Not on file   Social Determinants of Health   Financial Resource Strain: Not on file  Food Insecurity: Not on file  Transportation Needs: Not on file  Physical Activity: Not on file  Stress: Not on file  Social Connections: Not on file     Family History: The patient's family history includes Cancer in her father; Diabetes in her brother and sister; Heart disease in her mother.  ROS:   Please see the history of present illness.    All other systems reviewed and are negative.  EKGs/Labs/Other Studies Reviewed:    The following studies were reviewed today: EKG reveals sinus rhythm and nonspecific ST-T changes   Recent Labs: No results found for requested labs within last 8760 hours.  Recent Lipid Panel No results found for: CHOL, TRIG, HDL, CHOLHDL, VLDL, LDLCALC, LDLDIRECT  Physical Exam:    VS:  BP (!) 160/90   Pulse 84   Ht 5\' 6"  (1.676 m)   Wt 297 lb 3.2 oz (134.8 kg)   SpO2 96%   BMI 47.97 kg/m     Wt Readings from Last 3 Encounters:  08/27/21 297 lb 3.2 oz (134.8  kg)  07/15/21 300 lb (136.1 kg)  07/07/20 240 lb (108.9 kg)     GEN: Patient is in no acute distress HEENT: Normal NECK: No JVD; No carotid bruits LYMPHATICS: No lymphadenopathy CARDIAC: S1 S2 regular, 2/6 systolic murmur at the apex. RESPIRATORY:  Clear to auscultation without rales, wheezing or rhonchi  ABDOMEN: Soft, non-tender, non-distended MUSCULOSKELETAL:  No edema; No deformity  SKIN: Warm and dry NEUROLOGIC:  Alert and oriented x 3 PSYCHIATRIC:  Normal affect    Signed, 07/09/20, MD  08/27/2021 10:16 AM    [facility]

## 2021-08-27 NOTE — Patient Instructions (Addendum)
Medication Instructions:  Your physician has recommended you make the following change in your medication:   Use nitroglycerin 1 tablet placed under the tongue at the first sign of chest pain or an angina attack. 1 tablet may be used every 5 minutes as needed, for up to 15 minutes. Do not take more than 3 tablets in 15 minutes. If pain persist call 911 or go to the nearest ED.   *If you need a refill on your cardiac medications before your next appointment, please call your pharmacy*   Lab Work: None ordered If you have labs (blood work) drawn today and your tests are completely normal, you will receive your results only by: MyChart Message (if you have MyChart) OR A paper copy in the mail If you have any lab test that is abnormal or we need to change your treatment, we will call you to review the results.   Testing/Procedures: Your physician has requested that you have a lexiscan myoview. For further information please visit [email or web address] Please follow instruction sheet, as given.  The test will be done over 2 days and take approximately 3 to 4 hours each day to complete; you may bring reading material.  If someone comes with you to your appointment, they will need to remain in the main lobby due to limited space in the testing area.   How to prepare for your Myocardial Perfusion Test: Do not eat or drink 3 hours prior to your test, except you may have water. Do not consume products containing caffeine (regular or decaffeinated) 12 hours prior to your test. (ex: coffee, chocolate, sodas, tea). Do bring a list of your current medications with you.  If not listed below, you may take your medications as normal. Do wear comfortable clothes (no dresses or overalls) and walking shoes, tennis shoes preferred (No heels or open toe shoes are allowed). Do NOT wear cologne, perfume, aftershave, or lotions (deodorant is allowed). If these instructions are not followed, your test will have to  be rescheduled.    Follow-Up: At [facility], you and your health needs are our priority.  As part of our continuing mission to provide you with exceptional heart care, we have created designated Provider Care Teams.  These Care Teams include your primary Cardiologist (physician) and Advanced Practice Providers (APPs -  Physician Assistants and Nurse Practitioners) who all work together to provide you with the care you need, when you need it.  We recommend signing up for the patient portal called "MyChart".  Sign up information is provided on this After Visit Summary.  MyChart is used to connect with patients for Virtual Visits (Telemedicine).  Patients are able to view lab/test results, encounter notes, upcoming appointments, etc.  Non-urgent messages can be sent to your provider as well.   To learn more about what you can do with MyChart, go to [email or web address].    Your next appointment:   3 month(s)  The format for your next appointment:   In Person  Provider:   Belva Crome, MD   Other Instructions Cardiac Nuclear Scan A cardiac nuclear scan is a test that is done to check the flow of blood to your heart. It is done when you are resting and when you are exercising. The test looks for problems such as: Not enough blood reaching a portion of the heart. The heart muscle not working as it should. You may need this test if: You have heart disease. You have had lab results that are not  normal. You have had heart surgery or a balloon procedure to open up blocked arteries (angioplasty). You have chest pain. You have shortness of breath. In this test, a special dye (tracer) is put into your bloodstream. The tracer will travel to your heart. A camera will then take pictures of your heart to see how the tracer moves through your heart. This test is usually done at a hospital and takes 2-4 hours. Tell a doctor about: Any allergies you have. All medicines you are taking,  including vitamins, herbs, eye drops, creams, and over-the-counter medicines. Any problems you or family members have had with anesthetic medicines. Any blood disorders you have. Any surgeries you have had. Any medical conditions you have. Whether you are pregnant or may be pregnant. What are the risks? Generally, this is a safe test. However, problems may occur, such as: Serious chest pain and heart attack. This is only a risk if the stress portion of the test is done. Rapid heartbeat. A feeling of warmth in your chest. This feeling usually does not last long. Allergic reaction to the tracer. What happens before the test? Ask your doctor about changing or stopping your normal medicines. This is important. Follow instructions from your doctor about what you cannot eat or drink. Remove your jewelry on the day of the test. What happens during the test? An IV tube will be inserted into one of your veins. Your doctor will give you a small amount of tracer through the IV tube. You will wait for 20-40 minutes while the tracer moves through your bloodstream. Your heart will be monitored with an electrocardiogram (ECG). You will lie down on an exam table. Pictures of your heart will be taken for about 15-20 minutes. You may also have a stress test. For this test, one of these things may be done: You will be asked to exercise on a treadmill or a stationary bike. You will be given medicines that will make your heart work harder. This is done if you are unable to exercise. When blood flow to your heart has peaked, a tracer will again be given through the IV tube. After 20-40 minutes, you will get back on the exam table. More pictures will be taken of your heart. Depending on the tracer that is used, more pictures may need to be taken 3-4 hours later. Your IV tube will be removed when the test is over. The test may vary among doctors and hospitals. What happens after the test? Ask your  doctor: Whether you can return to your normal schedule, including diet, activities, and medicines. Whether you should drink more fluids. This will help to remove the tracer from your body. Drink enough fluid to keep your pee (urine) pale yellow. Ask your doctor, or the department that is doing the test: When will my results be ready? How will I get my results? Summary A cardiac nuclear scan is a test that is done to check the flow of blood to your heart. Tell your doctor whether you are pregnant or may be pregnant. Before the test, ask your doctor about changing or stopping your normal medicines. This is important. Ask your doctor whether you can return to your normal activities. You may be asked to drink more fluids. This information is not intended to replace advice given to you by your health care provider. Make sure you discuss any questions you have with your health care provider. Document Revised: 03/30/2019 Document Reviewed: 05/24/2018 Elsevier Patient Education  2021 Elsevier  Inc.

## 2021-09-10 DIAGNOSIS — I4510 Unspecified right bundle-branch block: Secondary | ICD-10-CM

## 2021-09-10 DIAGNOSIS — Z789 Other specified health status: Secondary | ICD-10-CM

## 2021-09-10 HISTORY — DX: Unspecified right bundle-branch block: I45.10

## 2021-09-10 HISTORY — DX: Other specified health status: Z78.9

## 2021-10-30 DIAGNOSIS — I25118 Atherosclerotic heart disease of native coronary artery with other forms of angina pectoris: Secondary | ICD-10-CM

## 2021-10-30 HISTORY — DX: Atherosclerotic heart disease of native coronary artery with other forms of angina pectoris: I25.118

## 2021-11-04 DIAGNOSIS — Z951 Presence of aortocoronary bypass graft: Secondary | ICD-10-CM

## 2021-11-04 HISTORY — DX: Presence of aortocoronary bypass graft: Z95.1

## 2021-11-09 DIAGNOSIS — D531 Other megaloblastic anemias, not elsewhere classified: Secondary | ICD-10-CM

## 2021-11-09 DIAGNOSIS — R296 Repeated falls: Secondary | ICD-10-CM

## 2021-11-09 DIAGNOSIS — J302 Other seasonal allergic rhinitis: Secondary | ICD-10-CM

## 2021-11-09 DIAGNOSIS — L84 Corns and callosities: Secondary | ICD-10-CM | POA: Insufficient documentation

## 2021-11-09 DIAGNOSIS — M25519 Pain in unspecified shoulder: Secondary | ICD-10-CM | POA: Insufficient documentation

## 2021-11-09 DIAGNOSIS — M7552 Bursitis of left shoulder: Secondary | ICD-10-CM

## 2021-11-09 DIAGNOSIS — N6311 Unspecified lump in the right breast, upper outer quadrant: Secondary | ICD-10-CM

## 2021-11-09 DIAGNOSIS — N393 Stress incontinence (female) (male): Secondary | ICD-10-CM

## 2021-11-09 DIAGNOSIS — M5430 Sciatica, unspecified side: Secondary | ICD-10-CM

## 2021-11-09 DIAGNOSIS — R682 Dry mouth, unspecified: Secondary | ICD-10-CM

## 2021-11-09 DIAGNOSIS — N760 Acute vaginitis: Secondary | ICD-10-CM | POA: Insufficient documentation

## 2021-11-09 DIAGNOSIS — E1165 Type 2 diabetes mellitus with hyperglycemia: Secondary | ICD-10-CM | POA: Insufficient documentation

## 2021-11-09 DIAGNOSIS — D72829 Elevated white blood cell count, unspecified: Secondary | ICD-10-CM | POA: Insufficient documentation

## 2021-11-09 DIAGNOSIS — Z9981 Dependence on supplemental oxygen: Secondary | ICD-10-CM

## 2021-11-09 DIAGNOSIS — N184 Chronic kidney disease, stage 4 (severe): Secondary | ICD-10-CM

## 2021-11-09 DIAGNOSIS — R040 Epistaxis: Secondary | ICD-10-CM

## 2021-11-09 DIAGNOSIS — M1991 Primary osteoarthritis, unspecified site: Secondary | ICD-10-CM

## 2021-11-09 DIAGNOSIS — B372 Candidiasis of skin and nail: Secondary | ICD-10-CM

## 2021-11-09 DIAGNOSIS — N390 Urinary tract infection, site not specified: Secondary | ICD-10-CM

## 2021-11-09 DIAGNOSIS — R110 Nausea: Secondary | ICD-10-CM | POA: Insufficient documentation

## 2021-11-09 DIAGNOSIS — T2005XA Burn of unspecified degree of scalp [any part], initial encounter: Secondary | ICD-10-CM

## 2021-11-09 DIAGNOSIS — I359 Nonrheumatic aortic valve disorder, unspecified: Secondary | ICD-10-CM

## 2021-11-09 DIAGNOSIS — M19019 Primary osteoarthritis, unspecified shoulder: Secondary | ICD-10-CM

## 2021-11-09 DIAGNOSIS — E611 Iron deficiency: Secondary | ICD-10-CM

## 2021-11-09 HISTORY — DX: Other seasonal allergic rhinitis: J30.2

## 2021-11-09 HISTORY — DX: Elevated white blood cell count, unspecified: D72.829

## 2021-11-09 HISTORY — DX: Acute vaginitis: N76.0

## 2021-11-09 HISTORY — DX: Repeated falls: R29.6

## 2021-11-09 HISTORY — DX: Epistaxis: R04.0

## 2021-11-09 HISTORY — DX: Primary osteoarthritis, unspecified shoulder: M19.019

## 2021-11-09 HISTORY — DX: Dependence on supplemental oxygen: Z99.81

## 2021-11-09 HISTORY — DX: Chronic kidney disease, stage 4 (severe): N18.4

## 2021-11-09 HISTORY — DX: Burn of unspecified degree of scalp (any part), initial encounter: T20.05XA

## 2021-11-09 HISTORY — DX: Primary osteoarthritis, unspecified site: M19.91

## 2021-11-09 HISTORY — DX: Candidiasis of skin and nail: B37.2

## 2021-11-09 HISTORY — DX: Other megaloblastic anemias, not elsewhere classified: D53.1

## 2021-11-09 HISTORY — DX: Bursitis of left shoulder: M75.52

## 2021-11-09 HISTORY — DX: Unspecified lump in the right breast, upper outer quadrant: N63.11

## 2021-11-09 HISTORY — DX: Stress incontinence (female) (male): N39.3

## 2021-11-09 HISTORY — DX: Urinary tract infection, site not specified: N39.0

## 2021-11-09 HISTORY — DX: Type 2 diabetes mellitus with hyperglycemia: E11.65

## 2021-11-09 HISTORY — DX: Sciatica, unspecified side: M54.30

## 2021-11-09 HISTORY — DX: Iron deficiency: E61.1

## 2021-11-09 HISTORY — DX: Corns and callosities: L84

## 2021-11-09 HISTORY — DX: Dry mouth, unspecified: R68.2

## 2021-11-09 HISTORY — DX: Nonrheumatic aortic valve disorder, unspecified: I35.9

## 2021-11-20 DIAGNOSIS — M130 Polyarthritis, unspecified: Secondary | ICD-10-CM

## 2021-11-20 HISTORY — DX: Polyarthritis, unspecified: M13.0

## 2022-02-04 DIAGNOSIS — M797 Fibromyalgia: Secondary | ICD-10-CM | POA: Insufficient documentation

## 2022-02-04 HISTORY — DX: Fibromyalgia: M79.7

## 2022-03-09 DIAGNOSIS — N179 Acute kidney failure, unspecified: Secondary | ICD-10-CM

## 2022-03-09 HISTORY — DX: Acute kidney failure, unspecified: N17.9

## 2022-03-09 HISTORY — DX: Hypomagnesemia: E83.42

## 2022-05-28 DIAGNOSIS — I25810 Atherosclerosis of coronary artery bypass graft(s) without angina pectoris: Secondary | ICD-10-CM

## 2022-05-28 HISTORY — DX: Atherosclerosis of coronary artery bypass graft(s) without angina pectoris: I25.810

## 2022-06-09 DIAGNOSIS — M2550 Pain in unspecified joint: Secondary | ICD-10-CM

## 2022-06-09 HISTORY — DX: Pain in unspecified joint: M25.50

## 2023-04-14 HISTORY — PX: CATARACT EXTRACTION W/ INTRAOCULAR LENS IMPLANT: SHX1309

## 2023-06-30 LAB — LAB REPORT - SCANNED
A1c: 6.6
EGFR: 40

## 2023-07-07 DIAGNOSIS — I4510 Unspecified right bundle-branch block: Secondary | ICD-10-CM

## 2023-07-08 DIAGNOSIS — J449 Chronic obstructive pulmonary disease, unspecified: Secondary | ICD-10-CM | POA: Diagnosis not present

## 2023-07-08 DIAGNOSIS — I10 Essential (primary) hypertension: Secondary | ICD-10-CM

## 2023-07-08 DIAGNOSIS — E785 Hyperlipidemia, unspecified: Secondary | ICD-10-CM

## 2023-07-08 DIAGNOSIS — I2510 Atherosclerotic heart disease of native coronary artery without angina pectoris: Secondary | ICD-10-CM

## 2023-07-08 DIAGNOSIS — N183 Chronic kidney disease, stage 3 unspecified: Secondary | ICD-10-CM

## 2023-07-08 DIAGNOSIS — I352 Nonrheumatic aortic (valve) stenosis with insufficiency: Secondary | ICD-10-CM | POA: Diagnosis not present

## 2023-07-08 LAB — LAB REPORT - SCANNED: EGFR: 46

## 2023-07-09 DIAGNOSIS — I10 Essential (primary) hypertension: Secondary | ICD-10-CM | POA: Diagnosis not present

## 2023-07-09 DIAGNOSIS — R079 Chest pain, unspecified: Secondary | ICD-10-CM

## 2023-07-09 DIAGNOSIS — J449 Chronic obstructive pulmonary disease, unspecified: Secondary | ICD-10-CM | POA: Diagnosis not present

## 2023-07-09 DIAGNOSIS — I2510 Atherosclerotic heart disease of native coronary artery without angina pectoris: Secondary | ICD-10-CM | POA: Diagnosis not present

## 2023-07-09 DIAGNOSIS — N183 Chronic kidney disease, stage 3 unspecified: Secondary | ICD-10-CM | POA: Diagnosis not present

## 2023-07-22 ENCOUNTER — Encounter: Payer: Self-pay | Admitting: *Deleted

## 2023-07-27 LAB — LAB REPORT - SCANNED: EGFR: 60

## 2023-07-29 ENCOUNTER — Other Ambulatory Visit: Payer: Self-pay

## 2023-07-31 NOTE — Progress Notes (Unsigned)
  Cardiology Office Note:  .   Date:  07/31/2023  ID:  Paige Jarvis, DOB 04/20/1966, MRN [id number] PCP: Paige Conradi, NP  [facility] Providers Cardiologist:  None { Click to update primary MD,subspecialty MD or APP then REFRESH:1}   History of Present Illness: .   Paige Jarvis is a 57 y.o. female with a past medical history of hypertension, rheumatoid arthritis, aortic stenosis, CAD s/p CABG, RBBB, COPD oxygen dependent, DM 2, hypothyroidism, CKD stage IV.  Echo 07/07/2023 mild concentric LVH, EF 55 to 60%, mild aortic stenosis, trivial TR 07/09/2023 Lexiscan negative for ischemia  Evaluated by Dr. Tomie China on 08/27/2021, she had some atypical type chest pain.  Changed admitted to [facility] on 07/07/2023 with chest pain, stress test was negative, chest pain felt to be related costochondritis that could have been exacerbated by her COPD.  Labs on day of discharge revealed stable H&H, creatinine 1.10, potassium 3.6, sodium 137.  ROS: ***  Studies Reviewed: .        *** Risk Assessment/Calculations:   {Does this patient have ATRIAL FIBRILLATION?:[id number]} No BP recorded.  {Refresh Note OR Click here to enter BP  :1}***       Physical Exam:   VS:  There were no vitals taken for this visit.   Wt Readings from Last 3 Encounters:  08/27/21 297 lb 3.2 oz (134.8 kg)  07/15/21 300 lb (136.1 kg)  07/07/20 240 lb (108.9 kg)    GEN: Well nourished, well developed in no acute distress NECK: No JVD; No carotid bruits CARDIAC: ***RRR, no murmurs, rubs, gallops RESPIRATORY:  Clear to auscultation without rales, wheezing or rhonchi  ABDOMEN: Soft, non-tender, non-distended EXTREMITIES:  No edema; No deformity   ASSESSMENT AND PLAN: .   ***    {Are you ordering a CV Procedure (e.g. stress test, cath, DCCV, TEE, etc)?   Press F2        :[id number]}  Dispo: ***  Signed, Flossie Dibble, NP

## 2023-08-03 ENCOUNTER — Ambulatory Visit: Payer: Medicaid Other | Attending: Cardiology | Admitting: Cardiology

## 2023-08-03 ENCOUNTER — Encounter: Payer: Self-pay | Admitting: Cardiology

## 2023-08-03 VITALS — BP 102/65 | HR 69 | Ht 67.0 in | Wt 318.2 lb

## 2023-08-03 DIAGNOSIS — Z951 Presence of aortocoronary bypass graft: Secondary | ICD-10-CM

## 2023-08-03 DIAGNOSIS — R0789 Other chest pain: Secondary | ICD-10-CM

## 2023-08-03 DIAGNOSIS — I447 Left bundle-branch block, unspecified: Secondary | ICD-10-CM

## 2023-08-03 DIAGNOSIS — R079 Chest pain, unspecified: Secondary | ICD-10-CM

## 2023-08-03 DIAGNOSIS — I4510 Unspecified right bundle-branch block: Secondary | ICD-10-CM

## 2023-08-03 DIAGNOSIS — I2510 Atherosclerotic heart disease of native coronary artery without angina pectoris: Secondary | ICD-10-CM

## 2023-08-03 MED ORDER — ISOSORBIDE MONONITRATE ER 30 MG PO TB24: 30.0000 mg | ORAL_TABLET | Freq: Every day | ORAL | 3 refills | Status: DC

## 2023-08-03 MED ORDER — COMFORT TOUCH BP CUFF/LARGE MISC: 1.0000 | Freq: Two times a day (BID) | 0 refills | Status: AC

## 2023-08-03 MED ORDER — NITROGLYCERIN 0.4 MG SL SUBL: 0.4000 mg | SUBLINGUAL_TABLET | SUBLINGUAL | 5 refills | Status: DC | PRN

## 2023-08-03 NOTE — Patient Instructions (Signed)
Medication Instructions:  Your physician has recommended you make the following change in your medication:  Stop Amlodipine Start Imdur 30 once daily  *If you need a refill on your cardiac medications before your next appointment, please call your pharmacy*   Lab Work: NONE If you have labs (blood work) drawn today and your tests are completely normal, you will receive your results only by: MyChart Message (if you have MyChart) OR A paper copy in the mail If you have any lab test that is abnormal or we need to change your treatment, we will call you to review the results.   Testing/Procedures: NONE   Follow-Up: At [facility], you and your health needs are our priority.  As part of our continuing mission to provide you with exceptional heart care, we have created designated Provider Care Teams.  These Care Teams include your primary Cardiologist (physician) and Advanced Practice Providers (APPs -  Physician Assistants and Nurse Practitioners) who all work together to provide you with the care you need, when you need it.  We recommend signing up for the patient portal called "MyChart".  Sign up information is provided on this After Visit Summary.  MyChart is used to connect with patients for Virtual Visits (Telemedicine).  Patients are able to view lab/test results, encounter notes, upcoming appointments, etc.  Non-urgent messages can be sent to your provider as well.   To learn more about what you can do with MyChart, go to [email or web address].    Your next appointment:   4 week(s)  Provider:   Other Instructions

## 2023-08-31 ENCOUNTER — Ambulatory Visit: Payer: Medicaid Other

## 2023-09-02 ENCOUNTER — Telehealth: Payer: Self-pay | Admitting: Cardiology

## 2023-09-02 NOTE — Telephone Encounter (Signed)
Patient states a BP cuff was going to be ordered for her and she still hasn't received it.

## 2023-09-03 NOTE — Telephone Encounter (Signed)
Let pt know that Rx was sent to agency that covers the BP cuffs and got email back saying that pt didn't qualify. Sent reply back but never heard from them. Asked pt to look on line and see if she could find a cuff for reasonable price. Pt verbalized understanding and had no further questions.

## 2023-09-18 ENCOUNTER — Other Ambulatory Visit: Payer: Self-pay

## 2023-09-23 ENCOUNTER — Ambulatory Visit: Payer: Medicaid Other

## 2023-10-21 LAB — LAB REPORT - SCANNED
A1c: 7.6
EGFR: 38

## 2023-11-26 ENCOUNTER — Ambulatory Visit: Payer: Medicaid Other

## 2023-11-26 VITALS — BP 132/74 | HR 72 | Ht 67.0 in | Wt 327.4 lb

## 2023-11-26 DIAGNOSIS — I25118 Atherosclerotic heart disease of native coronary artery with other forms of angina pectoris: Secondary | ICD-10-CM

## 2023-11-26 DIAGNOSIS — I2510 Atherosclerotic heart disease of native coronary artery without angina pectoris: Secondary | ICD-10-CM | POA: Diagnosis not present

## 2023-11-26 DIAGNOSIS — I350 Nonrheumatic aortic (valve) stenosis: Secondary | ICD-10-CM | POA: Diagnosis not present

## 2023-11-26 DIAGNOSIS — I5030 Unspecified diastolic (congestive) heart failure: Secondary | ICD-10-CM | POA: Insufficient documentation

## 2023-11-26 DIAGNOSIS — I10 Essential (primary) hypertension: Secondary | ICD-10-CM

## 2023-11-26 DIAGNOSIS — E782 Mixed hyperlipidemia: Secondary | ICD-10-CM

## 2023-11-26 DIAGNOSIS — I5032 Chronic diastolic (congestive) heart failure: Secondary | ICD-10-CM

## 2023-11-26 HISTORY — DX: Unspecified diastolic (congestive) heart failure: I50.30

## 2023-11-26 NOTE — Progress Notes (Signed)
Cardiology Consultation:    Date:  11/26/2023   ID:  Paige Jarvis, DOB Dec 02, 1966, MRN [id number]  PCP:  Hortencia Conradi, NP  Cardiologist:  Marlyn Corporal Jaymie Misch, MD   Referring MD: Hortencia Conradi, NP   No chief complaint on file.    ASSESSMENT AND PLAN:   Paige Jarvis 57 year old woman here for follow-up visit. Has history of CAD, s/p CABG in 2022, last stress test with nuclear imaging July 2024 without significant ischemia burden, chronic diastolic heart failure with preserved ejection fraction, last echocardiogram July 2024 with LVEF 55 to 60%, mild aortic stenosis, obesity, hypertension, diabetes, hyperlipidemia, hypothyroidism, rheumatoid arthritis, COPD O2 dependent 2 L nasal cannula flow, CKD stage IV    Problem List Items Addressed This Visit     Essential hypertension    Optimal. Advised to continue monitoring closely and if persistently above 130, will titrate up medications, tentatively hydralazine      Relevant Medications   isosorbide mononitrate (IMDUR) 30 MG 24 hr tablet   Mixed hyperlipidemia    Suboptimal on last blood work. She is continuing on atorvastatin 40 mg once daily and Zetia 10 mg once daily without any side effects. Discontinued Repatha about 3 months ago.  Would recommend rest starting Repatha 140 mg once every 2 weeks subcutaneous injection.  Will send out this prescription after reviewing her updated lipid panel.      Relevant Medications   isosorbide mononitrate (IMDUR) 30 MG 24 hr tablet   Mild aortic stenosis    Follow-up with repeat transthoracic echocardiogram tentatively in 1 year, will order at subsequent follow-up visits.      Relevant Medications   isosorbide mononitrate (IMDUR) 30 MG 24 hr tablet   Coronary artery disease of native artery of native heart with stable angina pectoris (HCC)    Asymptomatic at this time. Continue with risk factor modifications. Continue with aspirin 81 mg once daily Lipid panel management  as above. Advised to continue with calorie restriction to target weight loss.        Relevant Medications   isosorbide mononitrate (IMDUR) 30 MG 24 hr tablet   (HFpEF) heart failure with preserved ejection fraction (HCC)    Chronic heart failure with preserved ejection fraction.  Appears compensated. Appears to euvolemic. Continue with salt and fluid restriction to below 2 g/day and below 2 L/day respectively. She does have CKD stage IV.  Currently doing well with balance on 60 mg Lasix twice daily.      Relevant Medications   isosorbide mononitrate (IMDUR) 30 MG 24 hr tablet   Other Visit Diagnoses     Coronary artery disease involving native coronary artery of native heart without angina pectoris    -  Primary   Relevant Medications   isosorbide mononitrate (IMDUR) 30 MG 24 hr tablet   Other Relevant Orders   Lipid Profile         History of Present Illness:    Paige Jarvis is a 58 y.o. female who is being seen today for follow-up visit last visit in our office was 08/03/2023 with Wallis Bamberg, NP-C. PCP is Hortencia Conradi, NP.  Has history of CAD s/p CABG three-vessel bypass in 2022, chronic right bundle branch block morphology, obesity, hypertension, diabetes mellitus, hyperlipidemia, hypothyroidism, rheumatoid arthritis, COPD oxygen dependent 2 L nasal cannula flow, CKD stage IV, with preserved LVEF on last echocardiogram July 2024 with EF 55 to 60%, mild aortic stenosis and last stress test with nuclear imaging July  2024 showed no ischemia burden.  Pleasant woman here for the visit by herself. Denies any anginal symptoms. She does have atypical musculoskeletal sounding chest pain intermittently without any obvious triggers lasting for few seconds to minutes. Baseline shortness of breath with exertion multifactorial in the setting of COPD obesity, deconditioning.  No significant change in her baseline functional status.  Uses a walker to ambulate. Does have rare  palpitations lasting for few seconds.  No syncopal or near syncopal episodes.  She reports unable to lose weight. She reports mild increase in edema in lower extremities. She mentions good adherence with diet.  With lipid management she continues on atorvastatin and Zetia but Repatha has been out of her list for over 3 months.  She attributes having difficulty understanding how to give the injection to herself.  Denies any obvious pain or side effects using the medication.  She is willing to restart it if recommended.   Last lab work from October 21, 2023 hemoglobin A1c 7.6, EGFR 38. WBC 13.5, hemoglobin 14.4, hematocrit 44 Sodium 138, potassium 4.8 BUN 21, creatinine 1.52, EGFR 38 normal transaminases. Mildly elevated alkaline phosphatase 168 Lipid panel from October 20, 2023 total cholesterol 245, LDL 163, HDL 55, triglycerides 133.    Past Medical History:  Diagnosis Date   Acute kidney failure (HCC) 03/09/2022   Acute vaginitis 11/09/2021   Anxiety disorder    Aortic valve disorder 11/09/2021   Arteriosclerosis of coronary artery bypass graft 05/28/2022   Arthritis    Asthma    Atopic dermatitis    B12 deficiency anemia    Back pain    Bladder problem    Burn of scalp 11/09/2021   Bursitis of left shoulder 11/09/2021   Calf pain    when walking   Callosity 11/09/2021   Candidiasis of skin and nails 11/09/2021   Cellulitis of left lower limb    Cervicalgia    Chest pain    Chest pain of uncertain etiology 08/27/2021   COPD (chronic obstructive pulmonary disease) (HCC)    Coronary artery disease of native artery of native heart with stable angina pectoris (HCC) 10/30/2021   Cutaneous abscess of head (any part, except face)    Diabetes mellitus due to underlying condition with unspecified complications (HCC) 08/27/2021   Dizziness    Dry mouth 11/09/2021   Epistaxis 11/09/2021   Essential hypertension    Fatigue    Fever    Fibromyalgia 02/04/2022   Frequent  headaches    Gastro-esophageal reflux disease without esophagitis 07/16/2021   Heart murmur    History of blood transfusion    Hx of CABG 11/04/2021    CAD, s/p 3V CABG by Dr. Nevada Crane, 11/04/2021 with LIMA to LAD, LIMA as Y   graft to D1 off of the LIMA,  SVG to OM.   From the OP note:  The left internal mammary artery was anastomosed to   itself in an end to side fashion with a running 7-0 prolene suture. It was   then divided to create a "Y" graft for the diagonal and anterior   descending arteries.  One arm of the "Y" graft of the left internal    Hyperglycemia due to type 2 diabetes mellitus (HCC) 11/09/2021   Hypomagnesemia 03/09/2022   Hypothyroidism    Iron deficiency 11/09/2021   Leukocytosis 11/09/2021   Localized, primary osteoarthritis of shoulder region 11/09/2021   Long term (current) use of opiate analgesic    Lumbago with sciatica, right side  Major depressive disorder, recurrent, mild (HCC)    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 11/09/2021   Mild aortic stenosis 08/27/2021   Mixed hyperlipidemia    Morbid obesity (HCC) 08/27/2021   Multiple joint pain 06/09/2022   Muscle pain    Nausea 11/09/2021   Night sweats    Nonrheumatic aortic (valve) stenosis    Nutritional anemia 08/22/2021   Obesity    OSA (obstructive sleep apnea)    CPAP   Oxygen dependent 11/09/2021   Polyarthritis 11/20/2021   Poor circulation    Poor historian 09/10/2021   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   PTSD (post-traumatic stress disorder)    Recurrent falls 11/09/2021   Repeated falls    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)    Sciatica 11/09/2021   Seasonal allergic rhinitis 11/09/2021   Seasonal allergies    Shoulder joint pain 11/09/2021   Sinus problem    Stage 4 chronic kidney disease (HCC) 11/09/2021   Stress incontinence of urine 11/09/2021   Swelling    TRIGGER FINGER 03/14/2010   Qualifier: Diagnosis of  By: Romeo Apple MD, [place]      Type 2 diabetes mellitus without complication (HCC)    Unspecified lump in the right breast, upper outer quadrant 11/09/2021   Unspecified right bundle-branch block 09/10/2021   Urinary tract infectious disease 11/09/2021   Vitamin D deficiency    Weight decrease     Past Surgical History:  Procedure Laterality Date   CATARACT EXTRACTION W/ INTRAOCULAR LENS IMPLANT Left 04/14/2023   CHOLECYSTECTOMY     gall stone surgery     intestional surgery     blockage   SPLENECTOMY, TOTAL      Current Medications: Current Meds  Medication Sig   albuterol (PROVENTIL) (2.5 MG/3ML) 0.083% nebulizer solution Take 2.5 mg by nebulization 3 (three) times daily.   aspirin EC 81 MG tablet Take 81 mg by mouth daily with breakfast. Swallow whole.   atorvastatin (LIPITOR) 40 MG tablet Take 40 mg by mouth daily.   Blood Pressure Monitoring (COMFORT TOUCH BP CUFF/LARGE) MISC 1 Device by Does not apply route 2 (two) times daily. Per instructions given in office   Budeson-Glycopyrrol-Formoterol (BREZTRI AEROSPHERE) 160-9-4.8 MCG/ACT AERO Inhale 2 puffs into the lungs 2 (two) times daily as needed for shortness of breath (shortness of breath).   busPIRone (BUSPAR) 15 MG tablet Take 15 mg by mouth 2 (two) times daily.   cetirizine (ZYRTEC) 10 MG tablet Take 10 mg by mouth daily.   ezetimibe (ZETIA) 10 MG tablet Take 10 mg by mouth daily.   fluticasone (FLONASE) 50 MCG/ACT nasal spray Place 1 spray into both nostrils 2 (two) times daily.   furosemide (LASIX) 40 MG tablet Take 60 mg by mouth 2 (two) times daily.   gabapentin (NEURONTIN) 600 MG tablet Take 600 mg by mouth 3 (three) times daily.   hydrOXYzine (ATARAX/VISTARIL) 50 MG tablet Take 50 mg by mouth every 8 (eight) hours as needed for itching.   insulin aspart (FIASP FLEXTOUCH) 100 UNIT/ML FlexTouch Pen Inject 30 Units into the skin 3 (three) times daily. Per sliding scale, max of 30 units   insulin degludec (TRESIBA FLEXTOUCH) 200 UNIT/ML FlexTouch  Pen Inject 60 Units into the skin at bedtime.   INVOKANA 300 MG TABS tablet Take 300 mg by mouth daily.   isosorbide mononitrate (IMDUR) 30 MG 24 hr tablet Take 30 mg by mouth daily.   magnesium oxide (MAG-OX) 400 (240 Mg) MG tablet Take  1 tablet by mouth daily.   metoprolol tartrate (LOPRESSOR) 50 MG tablet Take 50 mg by mouth 2 (two) times daily.   nitroGLYCERIN (NITROSTAT) 0.4 MG SL tablet Place 1 tablet (0.4 mg total) under the tongue every 5 (five) minutes as needed for chest pain.   omeprazole (PRILOSEC) 20 MG capsule Take 20 mg by mouth daily before breakfast.   ondansetron (ZOFRAN) 8 MG tablet Take 8 mg by mouth every 12 (twelve) hours as needed for nausea or vomiting.   potassium chloride SA (K-DUR,KLOR-CON) 20 MEQ tablet Take 20 mEq by mouth in the morning and at bedtime.    REPATHA SURECLICK 140 MG/ML SOAJ Inject 140 mg into the skin every 14 (fourteen) days.   TRULICITY 0.75 MG/0.5ML SOPN Inject 0.75 mg into the skin once a week.   Vitamin D, Ergocalciferol, (DRISDOL) 1.25 MG (50000 UNIT) CAPS capsule Take 50,000 Units by mouth 3 (three) times a week.     Allergies:   Hydroxyzine hcl, Penicillins, Sertraline, and Lisinopril   Social History   Socioeconomic History   Marital status: Widowed    Spouse name: Not on file   Number of children: Not on file   Years of education: Not on file   Highest education level: Not on file  Occupational History   Not on file  Tobacco Use   Smoking status: Every Day    Types: Cigarettes   Smokeless tobacco: Never  Substance and Sexual Activity   Alcohol use: No   Drug use: No   Sexual activity: Not on file  Other Topics Concern   Not on file  Social History Narrative   Not on file   Social Determinants of Health   Financial Resource Strain: Not on file  Food Insecurity: Not on file  Transportation Needs: Not on file  Physical Activity: Not on file  Stress: Not on file  Social Connections: Not on file     Family History: The  patient's family history includes Cancer in her father; Diabetes in her brother and sister; Heart disease in her mother. ROS:   Please see the history of present illness.    All 14 point review of systems negative except as described per history of present illness.  EKGs/Labs/Other Studies Reviewed:    The following studies were reviewed today:   EKG:       Recent Labs: No results found for requested labs within last 365 days.  Recent Lipid Panel No results found for: "CHOL", "TRIG", "HDL", "CHOLHDL", "VLDL", "LDLCALC", "LDLDIRECT"  Physical Exam:    VS:  BP 132/74   Pulse 72   Ht 5\' 7"  (1.702 m)   Wt (!) 327 lb 6.4 oz (148.5 kg)   SpO2 93%   BMI 51.28 kg/m     Wt Readings from Last 3 Encounters:  11/26/23 (!) 327 lb 6.4 oz (148.5 kg)  08/03/23 (!) 318 lb 3.2 oz (144.3 kg)  08/27/21 297 lb 3.2 oz (134.8 kg)     GENERAL:  Well nourished, well developed in no acute distress NECK: No JVD; No carotid bruits CARDIAC: RRR, S1 and S2 present, no murmurs, no rubs, no gallops CHEST:  Clear to auscultation without rales, wheezing or rhonchi  Extremities: No pitting pedal edema. Pulses bilaterally symmetric with radial 2+ and dorsalis pedis 2+ NEUROLOGIC:  Alert and oriented x 3  Medication Adjustments/Labs and Tests Ordered: Current medicines are reviewed at length with the patient today.  Concerns regarding medicines are outlined above.  Orders Placed This  Encounter  Procedures   Lipid Profile   No orders of the defined types were placed in this encounter.   Signed, Makenleigh Crownover reddy Infantof Villagomez, MD, MPH, [facility]. 11/26/2023 1:38 PM    [facility]

## 2023-11-26 NOTE — Assessment & Plan Note (Signed)
Asymptomatic at this time. Continue with risk factor modifications. Continue with aspirin 81 mg once daily Lipid panel management as above. Advised to continue with calorie restriction to target weight loss.

## 2023-11-26 NOTE — Assessment & Plan Note (Signed)
Chronic heart failure with preserved ejection fraction.  Appears compensated. Appears to euvolemic. Continue with salt and fluid restriction to below 2 g/day and below 2 L/day respectively. She does have CKD stage IV.  Currently doing well with balance on 60 mg Lasix twice daily.

## 2023-11-26 NOTE — Assessment & Plan Note (Signed)
Optimal. Advised to continue monitoring closely and if persistently above 130, will titrate up medications, tentatively hydralazine

## 2023-11-26 NOTE — Assessment & Plan Note (Signed)
Follow-up with repeat transthoracic echocardiogram tentatively in 1 year, will order at subsequent follow-up visits.

## 2023-11-26 NOTE — Patient Instructions (Signed)
Medication Instructions:  Your physician recommends that you continue on your current medications as directed. Please refer to the Current Medication list given to you today.  *If you need a refill on your cardiac medications before your next appointment, please call your pharmacy*   Lab Work: Your physician recommends that you return for lab work in:   Labs today: Lipid  If you have labs (blood work) drawn today and your tests are completely normal, you will receive your results only by: MyChart Message (if you have MyChart) OR A paper copy in the mail If you have any lab test that is abnormal or we need to change your treatment, we will call you to review the results.   Testing/Procedures: None   Follow-Up: At [facility], you and your health needs are our priority.  As part of our continuing mission to provide you with exceptional heart care, we have created designated Provider Care Teams.  These Care Teams include your primary Cardiologist (physician) and Advanced Practice Providers (APPs -  Physician Assistants and Nurse Practitioners) who all work together to provide you with the care you need, when you need it.  We recommend signing up for the patient portal called "MyChart".  Sign up information is provided on this After Visit Summary.  MyChart is used to connect with patients for Virtual Visits (Telemedicine).  Patients are able to view lab/test results, encounter notes, upcoming appointments, etc.  Non-urgent messages can be sent to your provider as well.   To learn more about what you can do with MyChart, go to [email or web address].    Your next appointment:   6 month(s)  Provider:   Huntley Dec, MD    Other Instructions None

## 2023-11-26 NOTE — Assessment & Plan Note (Signed)
Suboptimal on last blood work. She is continuing on atorvastatin 40 mg once daily and Zetia 10 mg once daily without any side effects. Discontinued Repatha about 3 months ago.  Would recommend rest starting Repatha 140 mg once every 2 weeks subcutaneous injection.  Will send out this prescription after reviewing her updated lipid panel.

## 2023-11-27 ENCOUNTER — Telehealth: Payer: Self-pay

## 2023-11-27 LAB — LIPID PANEL
Chol/HDL Ratio: 4 {ratio} (ref 0.0–4.4)
Cholesterol, Total: 223 mg/dL — ABNORMAL HIGH (ref 100–199)
HDL: 56 mg/dL (ref >39)
LDL Chol Calc (NIH): 145 mg/dL — ABNORMAL HIGH (ref 0–99)
Triglycerides: 125 mg/dL (ref 0–149)
VLDL Cholesterol Cal: 22 mg/dL (ref 5–40)

## 2023-11-27 NOTE — Telephone Encounter (Signed)
-----   Message from [place] R Madireddy sent at 11/27/2023  8:24 AM EST ----- Please inform her that the lipid panel shows LDL levels suboptimal.  As discussed at the office visit yesterday, will recommend Repatha 140 mg once every 2 weeks subcutaneous injection. Please inform her and if agreeable send out the prescription. Thank you

## 2024-02-22 ENCOUNTER — Institutional Professional Consult (permissible substitution) (HOSPITAL_BASED_OUTPATIENT_CLINIC_OR_DEPARTMENT_OTHER): Payer: Medicaid Other | Admitting: Internal Medicine

## 2024-03-09 ENCOUNTER — Encounter: Payer: Self-pay | Admitting: Gastroenterology

## 2024-03-23 ENCOUNTER — Telehealth: Payer: Self-pay

## 2024-03-23 ENCOUNTER — Other Ambulatory Visit: Payer: Self-pay

## 2024-03-23 NOTE — Telephone Encounter (Signed)
 Pt c/o medication issue:  1. Name of Medication: atorvastatin (LIPITOR) 40 MG tablet   REPATHA SURECLICK 140 MG/ML SOAJ   2. How are you currently taking this medication (dosage and times per day)?   3. Are you having a reaction (difficulty breathing--STAT)? No  4. What is your medication issue? Pt would like a c/b regarding above medications, as she has concerns about being on both at the same time. Please advise

## 2024-03-23 NOTE — Telephone Encounter (Signed)
 Called the patient and she was concerned about taking atorvastatin at the same time she was taking Repatha? Her neighbor had told her that her doctor took her off the atorvastatin before starting her on Repatha. Patient is asking if she should be on both of these medications at the same time?

## 2024-03-23 NOTE — Telephone Encounter (Signed)
 Called patient and informed her of Dr. Madireddy's recommendation below:  "Please inform her statins [in her case atorvastatin] do help in preventing heart attacks and strokes in addition to lowering the cholesterol numbers. People who are not having good response in lowering cholesterol numbers do get recommended to add additional medicines and that is the reason she is on Zetia [ezetimibe] and now being recommended to resume Repatha.  In short there is reason and benefit of continuing to take statins while we escalate therapy with Repatha to bring the cholesterol numbers to an ideal level".   Patient verbalized understanding and had no further questions at this time.

## 2024-04-14 ENCOUNTER — Telehealth: Payer: Self-pay

## 2024-04-14 NOTE — Telephone Encounter (Signed)
 Pt c/o medication issue:  1. Name of Medication: metoprolol tartrate (LOPRESSOR) 50 MG tablet   2. How are you currently taking this medication (dosage and times per day)? Take 50 mg by mouth 2 (two) times daily.   3. Are you having a reaction (difficulty breathing--STAT)? yes  4. What is your medication issue? Medication has caused major skin issues for patient. PCP told patient to not take it anymore and to contact office

## 2024-04-15 NOTE — Telephone Encounter (Signed)
 Pt is calling back to follow up. She said, she will not take the medication this weekend until he hears back from the doctor

## 2024-04-16 ENCOUNTER — Ambulatory Visit (HOSPITAL_BASED_OUTPATIENT_CLINIC_OR_DEPARTMENT_OTHER): Admission: EM | Admit: 2024-04-16 | Discharge: 2024-04-16 | Disposition: A | Discharge status: Home / Self Care

## 2024-04-16 ENCOUNTER — Encounter (HOSPITAL_BASED_OUTPATIENT_CLINIC_OR_DEPARTMENT_OTHER): Payer: Self-pay | Admitting: Emergency Medicine

## 2024-04-16 DIAGNOSIS — B354 Tinea corporis: Secondary | ICD-10-CM

## 2024-04-16 MED ORDER — KETOCONAZOLE 2 % EX SHAM: 1.0000 | MEDICATED_SHAMPOO | CUTANEOUS | 0 refills | Status: AC

## 2024-04-16 MED ORDER — FLUCONAZOLE 200 MG PO TABS: 200.0000 mg | ORAL_TABLET | ORAL | 0 refills | Status: AC

## 2024-04-16 NOTE — ED Triage Notes (Signed)
 Pt reports spotty red areas all over her body, pt thinks its from her metoprolol that she was place on 2 years ago after heart surgery, pt also thinks she has a yeast infection under her right breast it smells per pt.

## 2024-04-16 NOTE — ED Provider Notes (Signed)
 [facility]    CSN: [id number] Arrival date & time: 04/16/24  5366      History   Chief Complaint No chief complaint on file.   HPI Paige Jarvis is a 58 y.o. female.   Patient is a 58 year old female who presents today with rash.  The rash is located to bilateral upper thigh areas, entire back, chest.  The rash is very itchy.  The rash has been there for some time and worsening.  She reports it started when taking metoprolol approximately 2 years ago and cardiologist recently took her off metoprolol.  She has a history of candidiasis in the past.  She has tried prednisone for the rash with no change.  Denies any fevers, chills, body aches or joint pain with this.     Past Medical History:  Diagnosis Date   Acute kidney failure (HCC) 03/09/2022   Acute vaginitis 11/09/2021   Anxiety disorder    Aortic valve disorder 11/09/2021   Arteriosclerosis of coronary artery bypass graft 05/28/2022   Arthritis    Asthma    Atopic dermatitis    B12 deficiency anemia    Back pain    Bladder problem    Burn of scalp 11/09/2021   Bursitis of left shoulder 11/09/2021   Calf pain    when walking   Callosity 11/09/2021   Candidiasis of skin and nails 11/09/2021   Cellulitis of left lower limb    Cervicalgia    Chest pain    Chest pain of uncertain etiology 08/27/2021   COPD (chronic obstructive pulmonary disease) (HCC)    Coronary artery disease of native artery of native heart with stable angina pectoris (HCC) 10/30/2021   Cutaneous abscess of head (any part, except face)    Diabetes mellitus due to underlying condition with unspecified complications (HCC) 08/27/2021   Dizziness    Dry mouth 11/09/2021   Epistaxis 11/09/2021   Essential hypertension    Fatigue    Fever    Fibromyalgia 02/04/2022   Frequent headaches    Gastro-esophageal reflux disease without esophagitis 07/16/2021   Heart murmur    History of blood transfusion    Hx of CABG 11/04/2021    CAD,  s/p 3V CABG by Dr. Marinus Sic, 11/04/2021 with LIMA to LAD, LIMA as Y   graft to D1 off of the LIMA,  SVG to OM.   From the OP note:  The left internal mammary artery was anastomosed to   itself in an end to side fashion with a running 7-0 prolene suture. It was   then divided to create a "Y" graft for the diagonal and anterior   descending arteries.  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 11/09/2021   Mild aortic stenosis 08/27/2021   Mixed hyperlipidemia    Morbid obesity (HCC) 08/27/2021   Multiple joint pain 06/09/2022   Muscle pain    Nausea 11/09/2021   Night sweats    Nonrheumatic aortic (valve) stenosis    Nutritional anemia 08/22/2021   Obesity    OSA (  obstructive sleep apnea)    CPAP   Oxygen dependent 11/09/2021   Polyarthritis 11/20/2021   Poor circulation    Poor historian 09/10/2021   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   PTSD (post-traumatic stress disorder)    Recurrent falls 11/09/2021   Repeated falls    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[facility])    Sciatica 11/09/2021   Seasonal allergic rhinitis 11/09/2021   Seasonal allergies    Shoulder joint pain 11/09/2021   Sinus problem    Stage 4 chronic kidney disease (HCC) 11/09/2021   Stress incontinence of urine 11/09/2021   Swelling    TRIGGER FINGER 03/14/2010   Qualifier: Diagnosis of  By: Phyllis Breeze MD, [place]     Type 2 diabetes mellitus without complication (HCC)    Unspecified lump in the right breast, upper outer quadrant 11/09/2021   Unspecified right  bundle-branch block 09/10/2021   Urinary tract infectious disease 11/09/2021   Vitamin D deficiency    Weight decrease     Patient Active Problem List   Diagnosis Date Noted   (HFpEF) heart failure with preserved ejection fraction (HCC) 11/26/2023   Multiple joint pain 06/09/2022   Arteriosclerosis of coronary artery bypass graft 05/28/2022   Acute kidney failure (HCC) 03/09/2022   Hypomagnesemia 03/09/2022   Fibromyalgia 02/04/2022   Polyarthritis 11/20/2021   Acute vaginitis 11/09/2021   Aortic valve disorder 11/09/2021   Burn of scalp 11/09/2021   Bursitis of left shoulder 11/09/2021   Callosity 11/09/2021   Candidiasis of skin and nails 11/09/2021   Dry mouth 11/09/2021   Epistaxis 11/09/2021   Hyperglycemia due to type 2 diabetes mellitus (HCC) 11/09/2021   Iron deficiency 11/09/2021   Leukocytosis 11/09/2021   Localized, primary osteoarthritis of shoulder region 11/09/2021   Oxygen dependent 11/09/2021   Nausea 11/09/2021   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   Recurrent falls 11/09/2021   Sciatica 11/09/2021   Seasonal allergic rhinitis 11/09/2021   Stage 4 chronic kidney disease (HCC) 11/09/2021   Stress incontinence of urine 11/09/2021   Unspecified lump in the right breast, upper outer quadrant 11/09/2021   Urinary tract infectious disease 11/09/2021   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 11/09/2021   Hx of CABG 11/04/2021   Coronary artery disease of native artery of native heart with stable angina pectoris (HCC) 10/30/2021   Poor historian 09/10/2021   Unspecified right bundle-branch block 09/10/2021   Chest pain of uncertain etiology 08/27/2021   Mild aortic stenosis 08/27/2021   Morbid obesity (HCC) 08/27/2021   Diabetes mellitus due to underlying condition with unspecified complications (HCC) 08/27/2021   Anxiety disorder 08/22/2021   Atopic dermatitis 08/22/2021   B12 deficiency anemia 08/22/2021   Back pain 08/22/2021   Bladder problem  08/22/2021   Calf pain 08/22/2021   Cellulitis of left lower limb 08/22/2021   Cervicalgia 08/22/2021   Chest pain 08/22/2021   Cutaneous abscess of head (any part, except face) 08/22/2021   Dizziness 08/22/2021   Fatigue 08/22/2021   Fever 08/22/2021   Frequent headaches 08/22/2021   Heart murmur 08/22/2021   History of blood transfusion 08/22/2021   Long term (current) use of opiate analgesic 08/22/2021   Lumbago with sciatica, right side 08/22/2021   Muscle pain 08/22/2021   Night sweats 08/22/2021   Poor circulation 08/22/2021   Obesity 08/22/2021   Repeated falls 08/22/2021   Sinus problem 08/22/2021   Swelling 08/22/2021   Vitamin D deficiency 08/22/2021   Weight decrease 08/22/2021   Nutritional anemia 08/22/2021  COPD (chronic obstructive pulmonary disease) (HCC) 07/16/2021   Essential hypertension 07/16/2021   Type 2 diabetes mellitus without complication (HCC) 07/16/2021   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/16/2021   PTSD (post-traumatic stress disorder) 07/16/2021   OSA (obstructive sleep apnea) 07/16/2021   Nonrheumatic aortic (valve) stenosis 07/16/2021   Major depressive disorder, recurrent, mild (HCC) 07/16/2021   Hypothyroidism 07/16/2021   Arthritis 07/16/2021   Asthma 07/16/2021   Gastro-esophageal reflux disease without esophagitis 07/16/2021   Mixed hyperlipidemia 07/16/2021   Seasonal allergies 07/16/2021   TRIGGER FINGER 03/14/2010    Past Surgical History:  Procedure Laterality Date   CATARACT EXTRACTION W/ INTRAOCULAR LENS IMPLANT Left 04/14/2023   CHOLECYSTECTOMY     gall stone surgery     intestional surgery     blockage   SPLENECTOMY, TOTAL      OB History   No obstetric history on file.      Home Medications    Prior to Admission medications   Medication Sig Start Date End Date Taking? Authorizing Provider  fluconazole (DIFLUCAN) 200 MG tablet Take 1 tablet (200 mg total) by mouth once a  week for 28 days. 04/16/24 05/14/24 Yes Jeremaine Maraj A, FNP  furosemide (LASIX) 40 MG tablet Take 60 mg by mouth 2 (two) times daily.   Yes [provider]  gabapentin  (NEURONTIN ) 600 MG tablet Take 600 mg by mouth 3 (three) times daily.   Yes [provider]  INVOKANA 300 MG TABS tablet Take 300 mg by mouth daily. 08/05/21  Yes [provider]  isosorbide  mononitrate (IMDUR ) 30 MG 24 hr tablet Take 30 mg by mouth daily.   Yes [provider]  ketoconazole (NIZORAL) 2 % shampoo Apply 1 Application topically 2 (two) times a week. 04/18/24  Yes Renette Hsu A, FNP  metoprolol tartrate (LOPRESSOR) 50 MG tablet Take 50 mg by mouth 2 (two) times daily. 07/23/23  Yes [provider]  omeprazole (PRILOSEC) 20 MG capsule Take 20 mg by mouth daily before breakfast.   Yes [provider]  OZEMPIC, 2 MG/DOSE, 8 MG/3ML SOPN 2 mg Subcutaneous Once a week for 84 days 06/30/23  Yes [provider]  potassium chloride  SA (K-DUR,KLOR-CON ) 20 MEQ tablet Take 20 mEq by mouth in the morning and at bedtime.    Yes [provider]  albuterol (PROVENTIL) (2.5 MG/3ML) 0.083% nebulizer solution Take 2.5 mg by nebulization 3 (three) times daily.    [provider]  aspirin EC 81 MG tablet Take 81 mg by mouth daily with breakfast. Swallow whole.    [provider]  atorvastatin (LIPITOR) 40 MG tablet Take 40 mg by mouth daily. 07/08/21   [provider]  Blood Pressure Monitoring (COMFORT TOUCH BP CUFF/LARGE) MISC 1 Device by Does not apply route 2 (two) times daily. Per instructions given in office 08/03/23   Terrance Ferretti, NP  Budeson-Glycopyrrol-Formoterol (BREZTRI AEROSPHERE) 160-9-4.8 MCG/ACT AERO Inhale 2 puffs into the lungs 2 (two) times daily as needed for shortness of breath (shortness of breath).    [provider]  busPIRone (BUSPAR) 15 MG tablet Take 15 mg by mouth 2 (two) times daily. 04/13/20   [provider]  cetirizine (ZYRTEC) 10 MG tablet Take 10 mg by mouth daily. 08/05/21   [provider]  ezetimibe (ZETIA) 10 MG tablet Take 10 mg by mouth daily. 06/22/23   [provider]  fluticasone (FLONASE) 50 MCG/ACT nasal spray Place 1 spray into  both nostrils 2 (two) times daily.    [provider]  hydrOXYzine (ATARAX/VISTARIL) 50 MG tablet Take 50 mg by mouth every 8 (eight) hours as needed for itching.    [provider]  insulin aspart (FIASP FLEXTOUCH) 100 UNIT/ML FlexTouch Pen Inject 30 Units into the skin 3 (three) times daily. Per sliding scale, max of 30 units 07/05/21   [provider]  insulin degludec (TRESIBA FLEXTOUCH) 200 UNIT/ML FlexTouch Pen Inject 60 Units into the skin at bedtime. 05/24/21   [provider]  magnesium oxide (MAG-OX) 400 (240 Mg) MG tablet Take 1 tablet by mouth daily. 07/22/23   [provider]  nitroGLYCERIN  (NITROSTAT ) 0.4 MG SL tablet Place 1 tablet (0.4 mg total) under the tongue every 5 (five) minutes as needed for chest pain. 08/03/23   Terrance Ferretti, NP  ondansetron (ZOFRAN) 8 MG tablet Take 8 mg by mouth every 12 (twelve) hours as needed for nausea or vomiting.    [provider]  REPATHA SURECLICK 140 MG/ML SOAJ Inject 140 mg into the skin every 14 (fourteen) days. 06/22/23   [provider]  TRULICITY 0.75 MG/0.5ML SOPN Inject 0.75 mg into the skin once a week. 08/10/21  Yes [provider]  Vitamin D, Ergocalciferol, (DRISDOL) 1.25 MG (50000 UNIT) CAPS capsule Take 50,000 Units by mouth 3 (three) times a week.    [provider]    Family History Family History  Problem Relation Age of Onset   Heart disease Mother    Cancer Father    Diabetes Sister    Diabetes Brother     Social History Social History   Tobacco Use   Smoking status: Every Day    Types: Cigarettes   Smokeless tobacco: Never  Substance Use Topics   Alcohol use: No   Drug use: No      Allergies   Lexapro [escitalopram], Penicillins, Sertraline, and Lisinopril   Review of Systems Review of Systems  See HPI  Physical Exam Triage Vital Signs ED Triage Vitals  Encounter Vitals Group     BP 04/16/24 1003 137/82     Systolic BP Percentile --      Diastolic BP Percentile --      Pulse Rate 04/16/24 1003 82     Resp 04/16/24 1003 20     Temp 04/16/24 1003 97.7 F (36.5 C)     Temp Source 04/16/24 1003 Oral     SpO2 04/16/24 1003 97 %     Weight --      Height --      Head Circumference --      Peak Flow --      Pain Score 04/16/24 1000 0     Pain Loc --      Pain Education --      Exclude from Growth Chart --    No data found.  Updated Vital Signs BP 137/82 (BP Location: Right Arm)   Pulse 82   Temp 97.7 F (36.5 C) (Oral)   Resp 20   SpO2 97%   Visual Acuity Right Eye Distance:   Left Eye Distance:   Bilateral Distance:    Right Eye Near:   Left Eye Near:    Bilateral Near:     Physical Exam Vitals and nursing note reviewed.  Constitutional:      General: She is not in acute distress.    Appearance: Normal appearance. She is not ill-appearing, toxic-appearing or diaphoretic.  Pulmonary:  Effort: Pulmonary effort is normal.  Skin:    General: Skin is warm and dry.     Findings: Erythema and rash present. Rash is scaling.       Neurological:     Mental Status: She is alert.  Psychiatric:        Mood and Affect: Mood normal.      UC Treatments / Results  Labs (all labs ordered are listed, but only abnormal results are displayed) Labs Reviewed - No data to display  EKG   Radiology No results found.  Procedures Procedures (including critical care time)  Medications Ordered in UC Medications - No data to display  Initial Impression / Assessment and Plan / UC Course  I have reviewed the triage vital signs and the nursing notes.  Pertinent labs & imaging results that were available during my care of the patient  were reviewed by me and considered in my medical decision making (see chart for details).     Tinea corporis-most likely diagnosis based on presentation.  This has been going on for some time and worsening.  Will go ahead and treat with oral fluconazole at this time.  Also prescribed ketoconazole shampoo. Patient has an appointment to see her dermatologist in May she can follow-up with them then Final Clinical Impressions(s) / UC Diagnoses   Final diagnoses:  Tinea corporis     Discharge Instructions      I am treating you for a fungal infection.  Take the fluconazole as prescribed.  You will take 1 tab weekly for 4 weeks. Use the ketoconazole shampoo twice a week.  You want to shampoo the scalp thoroughly and then let the soap sit on your skin for at least 5 minutes before rinsing Follow-up for any continued issues    ED Prescriptions     Medication Sig Dispense Auth. Provider   fluconazole (DIFLUCAN) 200 MG tablet Take 1 tablet (200 mg total) by mouth once a week for 28 days. 4 tablet Karl Erway A, FNP   ketoconazole (NIZORAL) 2 % shampoo Apply 1 Application topically 2 (two) times a week. 120 mL Jerri Morale A, FNP      PDMP not reviewed this encounter.   Landa Pine, FNP 04/16/24 1312

## 2024-04-16 NOTE — Discharge Instructions (Addendum)
 I am treating you for a fungal infection.  Take the fluconazole as prescribed.  You will take 1 tab weekly for 4 weeks. Use the ketoconazole shampoo twice a week.  You want to shampoo the scalp thoroughly and then let the soap sit on your skin for at least 5 minutes before rinsing Follow-up for any continued issues

## 2024-04-18 NOTE — Telephone Encounter (Signed)
 Pt states that she has had a rash for 2 years since starting Metoprolol. Pt states she has a round rash on her legs and other areas. Pt is monitoring her BP. Pt states that she stopped the medication 04/14/24. Please advise

## 2024-04-18 NOTE — Telephone Encounter (Signed)
Patient is calling back for update. Please advise  

## 2024-04-20 NOTE — Telephone Encounter (Signed)
 Recommendations reviewed with pt as per Dr. Madireddy's note.  Pt verbalized understanding and had no additional questions.

## 2024-04-26 LAB — LAB REPORT - SCANNED
A1c: 6.7
EGFR (Non-African Amer.): 42
Free T4: 1.54
TSH: 1.07 (ref 0.41–5.90)

## 2024-05-02 ENCOUNTER — Ambulatory Visit: Admitting: Gastroenterology

## 2024-05-25 ENCOUNTER — Ambulatory Visit

## 2024-05-25 VITALS — BP 152/88 | HR 86 | Ht 66.0 in | Wt 307.2 lb

## 2024-05-25 DIAGNOSIS — I25118 Atherosclerotic heart disease of native coronary artery with other forms of angina pectoris: Secondary | ICD-10-CM | POA: Insufficient documentation

## 2024-05-25 DIAGNOSIS — I10 Essential (primary) hypertension: Secondary | ICD-10-CM | POA: Insufficient documentation

## 2024-05-25 DIAGNOSIS — I5032 Chronic diastolic (congestive) heart failure: Secondary | ICD-10-CM | POA: Diagnosis present

## 2024-05-25 DIAGNOSIS — I350 Nonrheumatic aortic (valve) stenosis: Secondary | ICD-10-CM | POA: Insufficient documentation

## 2024-05-25 DIAGNOSIS — E782 Mixed hyperlipidemia: Secondary | ICD-10-CM | POA: Diagnosis not present

## 2024-05-25 MED ORDER — ATENOLOL 25 MG PO TABS: 25.0000 mg | ORAL_TABLET | Freq: Every day | ORAL | 3 refills | Status: AC

## 2024-05-25 MED ORDER — REPATHA SURECLICK 140 MG/ML ~~LOC~~ SOAJ: 140.0000 mg | SUBCUTANEOUS | 3 refills | Status: DC

## 2024-05-25 NOTE — Patient Instructions (Signed)
 Medication Instructions:  Your physician has recommended you make the following change in your medication:   Start Atenolol 25 mg daily  Start Repatha  *If you need a refill on your cardiac medications before your next appointment, please call your pharmacy*   Lab Work: Your physician recommends that you return for lab work in: 3 months for fasting lipids You need to have labs done when you are fasting.  You can come Monday through Friday 8:30 am to 12:00 pm and 1:15 to 4:30. You do not need to make an appointment as the order has already been placed.   If you have labs (blood work) drawn today and your tests are completely normal, you will receive your results only by: MyChart Message (if you have MyChart) OR A paper copy in the mail If you have any lab test that is abnormal or we need to change your treatment, we will call you to review the results.   Testing/Procedures: None ordered   Follow-Up: At [facility], you and your health needs are our priority.  As part of our continuing mission to provide you with exceptional heart care, we have created designated Provider Care Teams.  These Care Teams include your primary Cardiologist (physician) and Advanced Practice Providers (APPs -  Physician Assistants and Nurse Practitioners) who all work together to provide you with the care you need, when you need it.  We recommend signing up for the patient portal called "MyChart".  Sign up information is provided on this After Visit Summary.  MyChart is used to connect with patients for Virtual Visits (Telemedicine).  Patients are able to view lab/test results, encounter notes, upcoming appointments, etc.  Non-urgent messages can be sent to your provider as well.   To learn more about what you can do with MyChart, go to [email or web address].    Your next appointment:   3 month(s)  The format for your next appointment:   In Person  Provider:   Bertha Broad, MD    Other  Instructions none  Important Information About Sugar

## 2024-05-25 NOTE — Assessment & Plan Note (Signed)
 Suboptimal control. Target below 130/80 mmHg. Continue Imdur  30 mg once daily Start atenolol 25 mg once daily.

## 2024-05-25 NOTE — Assessment & Plan Note (Signed)
 Asymptomatic. S/p CABG in 2022. Last stress test July 2024 without ischemia.  Continue aspirin 81 mg once daily. Lipid-lowering therapy discussed under hyperlipidemia below. Atenolol being started for blood pressure optimization as discussed under hypertension. Continue Imdur  30 mg once daily.

## 2024-05-25 NOTE — Progress Notes (Signed)
 Cardiology Consultation:    Date:  05/25/2024   ID:  DELANY STEURY, DOB 03-05-1966, MRN [id number]  PCP:  Verdia Glad, NP  Cardiologist:  Daymon Evans Jabriel Vanduyne, MD   Referring MD: Verdia Glad, NP   No chief complaint on file.    ASSESSMENT AND PLAN:   Ms. Strong 58 year old woman with history of CAD s/p CABG in 2022, last stress test July 2024 without ischemia, chronic diastolic heart failure with preserved LVEF on last echocardiogram July 2024 EF 55 to 60%, mild aortic stenosis, obesity, hypertension, diabetes mellitus, hyperlipidemia, hypothyroidism, rheumatoid arthritis, COPD O2 dependent 2 L nasal cannula flow, CKD stage III/IV, reports a rash attributed to metoprolol use. Here for follow-up visit.  Problem List Items Addressed This Visit     Essential hypertension   Suboptimal control. Target below 130/80 mmHg. Continue Imdur  30 mg once daily Start atenolol 25 mg once daily.       Relevant Medications   atorvastatin (LIPITOR) 80 MG tablet   atenolol (TENORMIN) 25 MG tablet   REPATHA SURECLICK 140 MG/ML SOAJ   Mixed hyperlipidemia   Recent lipid panel from 5-05/2024 suboptimal with total cholesterol 205, HDL 50, triglycerides 116, LDL 133. Target LDL below 70 mg/dL. Continue atorvastatin 80 mg once daily Continue Zetia 10 mg once daily She has filled the prescription for Repatha 140 mg but has not started taking it due to concerns about potential medication side effects and unfamiliarity with injecting the medication herself.  We reviewed the mechanism of action of Repatha and potential side effects once again today and she is willing to start it today. Will follow-up in the office tentatively in 3 months with a fasting lipid panel prior to the visit.      Relevant Medications   atorvastatin (LIPITOR) 80 MG tablet   atenolol (TENORMIN) 25 MG tablet   REPATHA SURECLICK 140 MG/ML SOAJ   Other Relevant Orders   Lipid panel   Mild aortic stenosis    Follow-up echocardiogram tentatively after next office visit in 3 months.      Relevant Medications   atorvastatin (LIPITOR) 80 MG tablet   atenolol (TENORMIN) 25 MG tablet   REPATHA SURECLICK 140 MG/ML SOAJ   Coronary artery disease of native artery of native heart with stable angina pectoris (HCC)   Asymptomatic. S/p CABG in 2022. Last stress test July 2024 without ischemia.  Continue aspirin 81 mg once daily. Lipid-lowering therapy discussed under hyperlipidemia below. Atenolol being started for blood pressure optimization as discussed under hypertension. Continue Imdur  30 mg once daily.      Relevant Medications   atorvastatin (LIPITOR) 80 MG tablet   atenolol (TENORMIN) 25 MG tablet   REPATHA SURECLICK 140 MG/ML SOAJ   (HFpEF) heart failure with preserved ejection fraction (HCC) - Primary   Appears euvolemic and stable.  Compensated. Functional status limited due to musculoskeletal issues. Continue with salt restriction to below 2 g/day. Continue with furosemide 40 mg once daily.  CKD stage III/IV and limited mobility and high risk for UTIs, hence holding off on SGLT2 inhibitors. Starting atenolol as discussed below under hypertension.       Relevant Medications   atorvastatin (LIPITOR) 80 MG tablet   atenolol (TENORMIN) 25 MG tablet   REPATHA SURECLICK 140 MG/ML SOAJ  Return to clinic tentatively in 3 months with lipid panel done closer to the visit for follow-up.    History of Present Illness:    Paige Jarvis is a 58 y.o.  female who is being seen today for follow-up visit. PCP is Verdia Glad, NP. Last visit with me in the office was 11-26-2023.  Has history of CAD s/p CABG in 2022, last stress test July 2024 without ischemia, chronic diastolic heart failure with preserved LVEF on last echocardiogram July 2024 EF 55 to 60%, mild aortic stenosis, obesity, hypertension, diabetes mellitus, hyperlipidemia, hypothyroidism, rheumatoid arthritis, COPD O2  dependent 2 L nasal cannula flow, CKD stage III/IV, reports a rash attributed to metoprolol use.  Here for the visit by herself.  Lives by herself.  Mentions she has had longstanding rash since being started on metoprolol couple years ago and finally discontinued it couple months ago and mentions the rash has resolved. Overall at home mobility is limited using walker due to osteoarthritis. Blood pressures at home have been relatively elevated. She mentions she feels vague discomfort in the chest and occasionally has used sublingual nitroglycerin  with symptoms relief after 1 dose.  No recent symptoms requiring sublingual nitroglycerin  use.  She also reports an atypical musculoskeletal like chest pain which is worse with moving in a particular position.  She has not started taking Repatha due to concerns about already being on Lipitor and Zetia.   Blood work from 5-05/2024 hemoglobin A1c 6.7 Sodium 131, potassium 4.4 BUN 17, creatinine 1.39, EGFR 50 Alkaline phosphatase mildly elevated 186.  Normal AST and ALT. Lipid panel with total cholesterol 205, HDL 50, triglycerides 116, LDL 133, not optimal. Thyroid panel with elevated free T41.54.  TSH normal 1.07.   Past Medical History:  Diagnosis Date   (HFpEF) heart failure with preserved ejection fraction (HCC) 11/26/2023   Acute kidney failure (HCC) 03/09/2022   Acute vaginitis 11/09/2021   Anxiety disorder    Aortic valve disorder 11/09/2021   Arteriosclerosis of coronary artery bypass graft 05/28/2022   Arthritis    Asthma    Atopic dermatitis    B12 deficiency anemia    Back pain    Bladder problem    Burn of scalp 11/09/2021   Bursitis of left shoulder 11/09/2021   Calf pain    when walking   Callosity 11/09/2021   Candidiasis of skin and nails 11/09/2021   Cellulitis of left lower limb    Cervicalgia    Chest pain    Chest pain of uncertain etiology 08/27/2021   COPD (chronic obstructive pulmonary disease) (HCC)    Coronary  artery disease of native artery of native heart with stable angina pectoris (HCC) 10/30/2021   Cutaneous abscess of head (any part, except face)    Diabetes mellitus due to underlying condition with unspecified complications (HCC) 08/27/2021   Dizziness    Dry mouth 11/09/2021   Epistaxis 11/09/2021   Essential hypertension    Fatigue    Fever    Fibromyalgia 02/04/2022   Frequent headaches    Gastro-esophageal reflux disease without esophagitis 07/16/2021   Heart murmur    History of blood transfusion    Hx of CABG 11/04/2021    CAD, s/p 3V CABG by Dr. Marinus Sic, 11/04/2021 with LIMA to LAD, LIMA as Y   graft to D1 off of the LIMA,  SVG to OM.   From the OP note:  The left internal mammary artery was anastomosed to   itself in an end to side fashion with a running 7-0 prolene suture. It was   then divided to create a "Y" graft for the diagonal and anterior   descending arteries.  One arm of the "Y"  graft of the left internal    Hyperglycemia due to type 2 diabetes mellitus (HCC) 11/09/2021   Hypomagnesemia 03/09/2022   Hypothyroidism    Iron deficiency 11/09/2021   Leukocytosis 11/09/2021   Localized, primary osteoarthritis of shoulder region 11/09/2021   Long term (current) use of opiate analgesic    Lumbago with sciatica, right side    Major depressive disorder, recurrent, mild (HCC)    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 11/09/2021   Mild aortic stenosis 08/27/2021   Mixed hyperlipidemia    Morbid obesity (HCC) 08/27/2021   Multiple joint pain 06/09/2022   Muscle pain    Nausea 11/09/2021   Night sweats    Nonrheumatic aortic (valve) stenosis    Nutritional anemia 08/22/2021   Obesity    OSA (obstructive sleep apnea)    CPAP   Oxygen dependent 11/09/2021   Polyarthritis 11/20/2021   Poor circulation    Poor historian 09/10/2021   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   PTSD (post-traumatic stress disorder)    Recurrent falls 11/09/2021   Repeated falls     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)    Sciatica 11/09/2021   Seasonal allergic rhinitis 11/09/2021   Seasonal allergies    Shoulder joint pain 11/09/2021   Sinus problem    Stage 4 chronic kidney disease (HCC) 11/09/2021   Stress incontinence of urine 11/09/2021   Swelling    TRIGGER FINGER 03/14/2010   Qualifier: Diagnosis of  By: Phyllis Breeze MD, [place]     Type 2 diabetes mellitus without complication (HCC)    Unspecified lump in the right breast, upper outer quadrant 11/09/2021   Unspecified right bundle-branch block 09/10/2021   Urinary tract infectious disease 11/09/2021   Vitamin D deficiency    Weight decrease     Past Surgical History:  Procedure Laterality Date   CATARACT EXTRACTION W/ INTRAOCULAR LENS IMPLANT Left 04/14/2023   CHOLECYSTECTOMY     gall stone surgery     intestional surgery     blockage   SPLENECTOMY, TOTAL      Current Medications: Current Meds  Medication Sig   albuterol (PROVENTIL) (2.5 MG/3ML) 0.083% nebulizer solution Take 2.5 mg by nebulization 3 (three) times daily.   aspirin EC 81 MG tablet Take 81 mg by mouth daily with breakfast. Swallow whole.   atenolol (TENORMIN) 25 MG tablet Take 1 tablet (25 mg total) by mouth daily.   atorvastatin (LIPITOR) 80 MG tablet Take 80 mg by mouth 2 (two) times daily.   Blood Pressure Monitoring (COMFORT TOUCH BP CUFF/LARGE) MISC 1 Device by Does not apply route 2 (two) times daily. Per instructions given in office   Budeson-Glycopyrrol-Formoterol (BREZTRI AEROSPHERE) 160-9-4.8 MCG/ACT AERO Inhale 2 puffs into the lungs 2 (two) times daily as needed for shortness of breath (shortness of breath).   cetirizine (ZYRTEC) 10 MG tablet Take 10 mg by mouth daily.   ezetimibe (ZETIA) 10 MG tablet Take 10 mg by mouth daily.   furosemide (LASIX) 40 MG tablet Take 40 mg by mouth daily.   gabapentin  (NEURONTIN ) 600 MG tablet Take 600 mg by mouth 3 (three) times daily.   hydrOXYzine  (ATARAX/VISTARIL) 50 MG tablet Take 50 mg by mouth every 8 (eight) hours as needed for anxiety.   insulin aspart (FIASP FLEXTOUCH) 100 UNIT/ML FlexTouch Pen Inject 30 Units into the skin 3 (three) times daily. Per sliding scale, max of 30 units   insulin degludec (TRESIBA FLEXTOUCH) 200 UNIT/ML FlexTouch Pen Inject 60 Units  into the skin at bedtime.   INVOKANA 300 MG TABS tablet Take 300 mg by mouth daily.   isosorbide  mononitrate (IMDUR ) 30 MG 24 hr tablet Take 30 mg by mouth daily.   ketoconazole  (NIZORAL ) 2 % shampoo Apply 1 Application topically 2 (two) times a week.   magnesium oxide (MAG-OX) 400 (240 Mg) MG tablet Take 1 tablet by mouth daily.   nitroGLYCERIN  (NITROSTAT ) 0.4 MG SL tablet Place 1 tablet (0.4 mg total) under the tongue every 5 (five) minutes as needed for chest pain.   omeprazole (PRILOSEC) 20 MG capsule Take 20 mg by mouth daily before breakfast.   ondansetron (ZOFRAN) 8 MG tablet Take 8 mg by mouth every 12 (twelve) hours as needed for nausea or vomiting.   OZEMPIC, 2 MG/DOSE, 8 MG/3ML SOPN Inject 2 mg into the skin once a week.   potassium chloride  SA (K-DUR,KLOR-CON ) 20 MEQ tablet Take 20 mEq by mouth daily.   Vitamin D, Ergocalciferol, (DRISDOL) 1.25 MG (50000 UNIT) CAPS capsule Take 50,000 Units by mouth 3 (three) times a week.     Allergies:   Lexapro [escitalopram], Metoprolol, Penicillins, Sertraline, and Lisinopril   Social History   Socioeconomic History   Marital status: Widowed    Spouse name: Not on file   Number of children: Not on file   Years of education: Not on file   Highest education level: Not on file  Occupational History   Not on file  Tobacco Use   Smoking status: Every Day    Types: Cigarettes   Smokeless tobacco: Never  Substance and Sexual Activity   Alcohol use: No   Drug use: No   Sexual activity: Not on file  Other Topics Concern   Not on file  Social History Narrative   Not on file   Social Drivers of Health   Financial  Resource Strain: Not on file  Food Insecurity: Not on file  Transportation Needs: Not on file  Physical Activity: Not on file  Stress: Not on file  Social Connections: Not on file     Family History: The patient's family history includes Cancer in her father; Diabetes in her brother and sister; Heart disease in her mother. ROS:   Please see the history of present illness.    All 14 point review of systems negative except as described per history of present illness.  EKGs/Labs/Other Studies Reviewed:    The following studies were reviewed today:   EKG:       Recent Labs: No results found for requested labs within last 365 days.  Recent Lipid Panel    Component Value Date/Time   CHOL 223 (H) 11/26/2023 1335   TRIG 125 11/26/2023 1335   HDL 56 11/26/2023 1335   CHOLHDL 4.0 11/26/2023 1335   LDLCALC 145 (H) 11/26/2023 1335    Physical Exam:    VS:  BP (!) 152/88   Pulse 86   Ht 5\' 6"  (1.676 m)   Wt (!) 307 lb 3.2 oz (139.3 kg)   SpO2 95%   BMI 49.58 kg/m     Wt Readings from Last 3 Encounters:  05/25/24 (!) 307 lb 3.2 oz (139.3 kg)  11/26/23 (!) 327 lb 6.4 oz (148.5 kg)  08/03/23 (!) 318 lb 3.2 oz (144.3 kg)     GENERAL:  Well nourished, well developed in no acute distress NECK: No JVD; No carotid bruits CARDIAC: RRR, S1 and S2 present, no murmurs, no rubs, no gallops CHEST:  Clear to auscultation  without rales, wheezing or rhonchi  Extremities: No pitting pedal edema. Pulses bilaterally symmetric with radial 2+ and dorsalis pedis 2+ NEUROLOGIC:  Alert and oriented x 3  Medication Adjustments/Labs and Tests Ordered: Current medicines are reviewed at length with the patient today.  Concerns regarding medicines are outlined above.  Orders Placed This Encounter  Procedures   Lipid panel   Meds ordered this encounter  Medications   atenolol (TENORMIN) 25 MG tablet    Sig: Take 1 tablet (25 mg total) by mouth daily.    Dispense:  90 tablet    Refill:  3    REPATHA SURECLICK 140 MG/ML SOAJ    Sig: Inject 140 mg into the skin every 14 (fourteen) days.    Dispense:  6 mL    Refill:  3    Signed, Delesia Martinek reddy Salih Williamson, MD, MPH, [facility]. 05/25/2024 9:12 AM     [facility]

## 2024-05-25 NOTE — Assessment & Plan Note (Signed)
 Follow-up echocardiogram tentatively after next office visit in 3 months.

## 2024-05-25 NOTE — Assessment & Plan Note (Signed)
 Appears euvolemic and stable.  Compensated. Functional status limited due to musculoskeletal issues. Continue with salt restriction to below 2 g/day. Continue with furosemide 40 mg once daily.  CKD stage III/IV and limited mobility and high risk for UTIs, hence holding off on SGLT2 inhibitors. Starting atenolol as discussed below under hypertension.

## 2024-05-25 NOTE — Assessment & Plan Note (Signed)
 Recent lipid panel from 5-05/2024 suboptimal with total cholesterol 205, HDL 50, triglycerides 116, LDL 133. Target LDL below 70 mg/dL. Continue atorvastatin 80 mg once daily Continue Zetia 10 mg once daily She has filled the prescription for Repatha 140 mg but has not started taking it due to concerns about potential medication side effects and unfamiliarity with injecting the medication herself.  We reviewed the mechanism of action of Repatha and potential side effects once again today and she is willing to start it today. Will follow-up in the office tentatively in 3 months with a fasting lipid panel prior to the visit.

## 2024-06-20 ENCOUNTER — Ambulatory Visit: Admitting: Gastroenterology

## 2024-07-15 ENCOUNTER — Telehealth: Payer: Self-pay

## 2024-07-15 MED ORDER — NITROGLYCERIN 0.4 MG SL SUBL: 0.4000 mg | SUBLINGUAL_TABLET | SUBLINGUAL | 3 refills | Status: DC | PRN

## 2024-07-15 NOTE — Telephone Encounter (Signed)
*  STAT* If patient is at the pharmacy, call can be transferred to refill team.   1. Which medications need to be refilled? (please list name of each medication and dose if known)   nitroGLYCERIN  (NITROSTAT ) 0.4 MG SL tablet    4. Which pharmacy/location (including street and city if local pharmacy) is medication to be sent to?  [id number] - [place], [place] - [address] AT [address]     5. Do they need a 30 day or 90 day supply? 90

## 2024-07-15 NOTE — Telephone Encounter (Signed)
 RX sent to requested Pharmacy

## 2024-08-08 ENCOUNTER — Other Ambulatory Visit: Payer: Self-pay

## 2024-08-08 ENCOUNTER — Telehealth: Payer: Self-pay

## 2024-08-08 MED ORDER — ISOSORBIDE MONONITRATE ER 30 MG PO TB24: 30.0000 mg | ORAL_TABLET | Freq: Every day | ORAL | 0 refills | Status: DC

## 2024-08-08 NOTE — Telephone Encounter (Signed)
*  STAT* If patient is at the pharmacy, call can be transferred to refill team.   1. Which medications need to be refilled? (please list name of each medication and dose if known) isosorbide  mononitrate (IMDUR ) 30 MG 24 hr tablet   2. Which pharmacy/location (including street and city if local pharmacy) is medication to be sent to? [id number] [place], [place] - [address] AT [facility] [address] Phone: [phone]  Fax: [phone]     3. Do they need a 30 day or 90 day supply? 90  Pt has 3 Pills left

## 2024-08-09 NOTE — Progress Notes (Unsigned)
 SABRA

## 2024-08-10 ENCOUNTER — Ambulatory Visit: Admitting: Gastroenterology

## 2024-08-15 ENCOUNTER — Other Ambulatory Visit (HOSPITAL_COMMUNITY): Payer: Self-pay

## 2024-08-15 ENCOUNTER — Other Ambulatory Visit: Payer: Self-pay

## 2024-08-15 ENCOUNTER — Telehealth: Payer: Self-pay

## 2024-08-15 DIAGNOSIS — E782 Mixed hyperlipidemia: Secondary | ICD-10-CM

## 2024-08-15 DIAGNOSIS — I25118 Atherosclerotic heart disease of native coronary artery with other forms of angina pectoris: Secondary | ICD-10-CM

## 2024-08-15 MED ORDER — REPATHA SURECLICK 140 MG/ML ~~LOC~~ SOAJ: 140.0000 mg | SUBCUTANEOUS | 1 refills | Status: AC

## 2024-08-15 MED ORDER — EZETIMIBE 10 MG PO TABS: 10.0000 mg | ORAL_TABLET | Freq: Every day | ORAL | 2 refills | Status: DC

## 2024-08-15 NOTE — Telephone Encounter (Signed)
 Pt c/o medication issue:  1. Name of Medication:   REPATHA  SURECLICK 140 MG/ML SOAJ    2. How are you currently taking this medication (dosage and times per day)? As written   3. Are you having a reaction (difficulty breathing--STAT)? No   4. What is your medication issue? Pharmacy called in stating this med needs PA

## 2024-08-15 NOTE — Telephone Encounter (Signed)
PA request has been Submitted.

## 2024-08-15 NOTE — Telephone Encounter (Signed)
*  STAT* If patient is at the pharmacy, call can be transferred to refill team.   1. Which medications need to be refilled? (please list name of each medication and dose if known) ezetimibe  (ZETIA ) 10 MG tablet   REPATHA  SURECLICK 140 MG/ML SOAJ   2. Which pharmacy/location (including street and city if local pharmacy) is medication to be sent to?  [id number] - [place], [place] - [address] AT [facility] [address]      3. Do they need a 30 day or 90 day supply? 90 day    Pt is out of medication

## 2024-08-18 NOTE — Telephone Encounter (Signed)
 PA request has been Submitted. Status still pending

## 2024-08-18 NOTE — Telephone Encounter (Signed)
 Patient called in because she states that her insurance company is requesting more information before approving the Repatha . She states that she is late on her injections and is upset.

## 2024-08-23 NOTE — Telephone Encounter (Signed)
 Pharmacy Patient Advocate Encounter  Received notification from [employer] that Prior Authorization for REPATHA  has been APPROVED from 08/19/24 to 08/19/25

## 2024-08-30 ENCOUNTER — Ambulatory Visit

## 2024-12-20 ENCOUNTER — Ambulatory Visit

## 2025-01-02 ENCOUNTER — Ambulatory Visit

## 2025-01-02 ENCOUNTER — Other Ambulatory Visit: Payer: Self-pay

## 2025-01-02 VITALS — BP 126/88 | HR 78 | Ht 67.0 in | Wt 322.0 lb

## 2025-01-02 DIAGNOSIS — E782 Mixed hyperlipidemia: Secondary | ICD-10-CM

## 2025-01-02 DIAGNOSIS — I25118 Atherosclerotic heart disease of native coronary artery with other forms of angina pectoris: Secondary | ICD-10-CM | POA: Diagnosis not present

## 2025-01-02 DIAGNOSIS — I350 Nonrheumatic aortic (valve) stenosis: Secondary | ICD-10-CM | POA: Insufficient documentation

## 2025-01-02 DIAGNOSIS — I5032 Chronic diastolic (congestive) heart failure: Secondary | ICD-10-CM | POA: Insufficient documentation

## 2025-01-02 MED ORDER — FUROSEMIDE 40 MG PO TABS: ORAL_TABLET | ORAL | 3 refills | Status: AC

## 2025-01-02 MED ORDER — NITROGLYCERIN 0.4 MG SL SUBL: 0.4000 mg | SUBLINGUAL_TABLET | SUBLINGUAL | 3 refills | Status: AC | PRN

## 2025-01-02 MED ORDER — EZETIMIBE 10 MG PO TABS: 10.0000 mg | ORAL_TABLET | Freq: Every day | ORAL | 2 refills | Status: AC

## 2025-01-02 MED ORDER — ISOSORBIDE MONONITRATE ER 30 MG PO TB24: 30.0000 mg | ORAL_TABLET | Freq: Every day | ORAL | 0 refills | Status: DC

## 2025-01-02 NOTE — Progress Notes (Signed)
 "  Cardiology Consultation:    Date:  01/02/2025   ID:  Paige Jarvis, DOB 04/29/1966, MRN [id number]  PCP:  Paige Lamar BRAVO, NP  Cardiologist:  Paige SAUNDERS Aubrey Blackard, MD   Referring MD: Paige Lamar BRAVO, NP   No chief complaint on file.    ASSESSMENT AND PLAN:   [name] 59 year old woman with history of  CAD s/p CABG in 2022, last stress test July 2024 without ischemia, chronic diastolic heart failure with preserved LVEF on last echocardiogram July 2024 at [facility] EF 55 to 60%, mild aortic stenosis [V-max 2.4 m/s, mean gradient 15 mmHg], mild aortic insufficiency, obesity, hypertension, diabetes mellitus, hyperlipidemia, hypothyroidism, rheumatoid arthritis, COPD O2 dependent 2 L nasal cannula flow, CKD stage III/IV, limited mobility due to osteoarthritis and uses a walker, reports a rash attributed to metoprolol use.    Here for follow-up visit, notably increased weight and bilateral lower extremity edema.  Problem List Items Addressed This Visit       Cardiovascular and Mediastinum   Mild aortic stenosis   Prior echocardiogram was from July 2024 at [facility] V-max 2.4 m/s, mean gradient 15 mmHg and associated with mild aortic insufficiency.  Follow-up with repeat transthoracic echocardiogram for interval change assessment.      Relevant Medications   isosorbide  mononitrate (IMDUR ) 30 MG 24 hr tablet   nitroGLYCERIN  (NITROSTAT ) 0.4 MG SL tablet   ezetimibe  (ZETIA ) 10 MG tablet   furosemide  (LASIX ) 40 MG tablet   Other Relevant Orders   ECHOCARDIOGRAM COMPLETE   Coronary artery disease of native artery of native heart with stable angina pectoris   S/p CABG in 2022. Last tress test with nuclear imaging July 2024 at [facility] without ischemia.  Relatively asymptomatic. Continue aspirin 81 mg once daily. Continue atorvastatin 80 mg once daily.  Remains on metoprolol 25 mg once daily and Imdur  30 mg once daily.        Relevant Medications   isosorbide  mononitrate (IMDUR ) 30 MG 24 hr tablet   nitroGLYCERIN  (NITROSTAT ) 0.4 MG SL tablet   ezetimibe  (ZETIA ) 10 MG tablet   furosemide  (LASIX ) 40 MG tablet   Other Relevant Orders   EKG 12-Lead (Completed)   (HFpEF) heart failure with preserved ejection fraction (HCC) - Primary   Chronic heart failure with preserved ejection fraction, LVEF 55 to 60% on echocardiogram July 2024 at [facility].  Bilateral lower extremity edema  Discussed extensively about salt restriction and recommended to keep it below 2 g/day and avoid entirely if possible.  Continue furosemide  in addition to her current 40 mg once daily advised her to take an additional 40 mg dose every other day in the morning.  Continue atenolol  25 mg once daily. Advised her to notify us  of any significant chest pains with higher dose of Lasix  in about 1 week to 10 days.    If no significant improvement will have her further increase the Lasix  dose to 80 mg twice daily.  Will have her return for close follow-up tentatively in 6 to 8 weeks.       Relevant Medications   isosorbide  mononitrate (IMDUR ) 30 MG 24 hr tablet   nitroGLYCERIN  (NITROSTAT ) 0.4 MG SL tablet   ezetimibe  (ZETIA ) 10 MG tablet   furosemide  (LASIX ) 40 MG tablet   Other Relevant Orders   EKG 12-Lead (Completed)   ECHOCARDIOGRAM COMPLETE     Other   Mixed hyperlipidemia   Last panel to review is from 04/26/2024. Not  at goal.  Repeat lipid panel previously ordered she had the blood draw done today at the office. Will review results once available.  Continue atorvastatin 80 mg once daily Zetia  10 mg once daily Repatha  140 mg subcutaneous injection once every 14 days.  She wishes to titrate down the medications. If lipid panel levels show improvement in optimal we will consider discontinuing Zetia .       Relevant Medications   isosorbide  mononitrate (IMDUR ) 30 MG 24 hr tablet   nitroGLYCERIN  (NITROSTAT ) 0.4 MG  SL tablet   ezetimibe  (ZETIA ) 10 MG tablet   furosemide  (LASIX ) 40 MG tablet   Return to clinic tentatively in 6 to 8 weeks.   History of Present Illness:    Paige Jarvis is a 59 y.o. female who is being seen today for a follow-up visit. PCP is Paige Lamar BRAVO, NP. Last visit with me in the office was 05/25/2024.  Pleasant woman here for the visit today by herself.  Lives by herself.  Ambulates using a walker, with diffuse musculoskeletal aches and pains.  Has history of  CAD s/p CABG in 2022, last stress test July 2024 without ischemia, chronic diastolic heart failure with preserved LVEF on last echocardiogram July 2024 at [facility] EF 55 to 60%, mild aortic stenosis [V-max 2.4 m/s, mean gradient 15 mmHg], mild aortic insufficiency, obesity, hypertension, diabetes mellitus, hyperlipidemia, hypothyroidism, rheumatoid arthritis, COPD O2 dependent 2 L nasal cannula flow, CKD stage III/IV, limited mobility due to osteoarthritis and uses a walker, reports a rash attributed to metoprolol use.    Mentions overall she is dealing with diffuse musculoskeletal aches and pains. Recently came down with an upper respiratory infection and feeling congested and with cough, symptoms gradually improving.  She has gained up to 20 pounds in the past 6 months. Has been bilateral lower extremity edema which is worse towards the end of the day and relieves when she is able to sleep having her feet up on the bed. Over the last few weeks she has been sleeping in a recliner and notes her lower extremity edema has persisted.  Denies any chest pain. Denies palpitations. Denies any syncopal episodes.  Has a mild rash for which she has been taking cetirizine.  Good compliance with her medications. Blood pressures have been well-controlled. Mentions she does not feel her urine output increases much after taking her Lasix  pill.   Past Medical History:  Diagnosis Date   (HFpEF) heart failure  with preserved ejection fraction (HCC) 11/26/2023   Acute kidney failure 03/09/2022   Acute vaginitis 11/09/2021   Anxiety disorder    Aortic valve disorder 11/09/2021   Arteriosclerosis of coronary artery bypass graft 05/28/2022   Arthritis    Asthma    Atopic dermatitis    B12 deficiency anemia    Back pain    Bladder problem    Burn of scalp 11/09/2021   Bursitis of left shoulder 11/09/2021   Calf pain    when walking   Callosity 11/09/2021   Candidiasis of skin and nails 11/09/2021   Cellulitis of left lower limb    Cervicalgia    Chest pain    Chest pain of uncertain etiology 08/27/2021   COPD (chronic obstructive pulmonary disease) (HCC)    Coronary artery disease of native artery of native heart with stable angina pectoris 10/30/2021   Cutaneous abscess of head (any part, except face)    Diabetes mellitus due to underlying condition with unspecified complications (HCC) 08/27/2021  Dizziness    Dry mouth 11/09/2021   Epistaxis 11/09/2021   Essential hypertension    Fatigue    Fever    Fibromyalgia 02/04/2022   Frequent headaches    Gastro-esophageal reflux disease without esophagitis 07/16/2021   Heart murmur    History of blood transfusion    Hx of CABG 11/04/2021    CAD, s/p 3V CABG by Dr. Theresa, 11/04/2021 with LIMA to LAD, LIMA as Y   graft to D1 off of the LIMA,  SVG to OM.   From the OP note:  The left internal mammary artery was anastomosed to   itself in an end to side fashion with a running 7-0 prolene suture. It was   then divided to create a Y graft for the diagonal and anterior   descending arteries.  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 11/09/2021   Mild aortic stenosis 08/27/2021   Mixed hyperlipidemia    Morbid obesity (HCC) 08/27/2021   Multiple joint pain 06/09/2022   Muscle pain    Nausea 11/09/2021   Night sweats    Nonrheumatic aortic (valve) stenosis    Nutritional anemia 08/22/2021   Obesity    OSA (obstructive sleep apnea)    CPAP   Oxygen dependent 11/09/2021   Polyarthritis 11/20/2021   Poor circulation    Poor historian 09/10/2021   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   PTSD (post-traumatic stress disorder)    Recurrent falls 11/09/2021   Repeated falls    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[facility])    Sciatica 11/09/2021   Seasonal allergic rhinitis 11/09/2021   Seasonal allergies    Shoulder joint pain 11/09/2021   Sinus problem    Stage 4 chronic kidney disease (HCC) 11/09/2021   Stress incontinence of urine 11/09/2021   Swelling    TRIGGER FINGER 03/14/2010   Qualifier: Diagnosis of  By: Margrette MD, Stanley     Type 2 diabetes mellitus without complication    Unspecified lump in the right breast, upper outer quadrant 11/09/2021   Unspecified right bundle-branch block 09/10/2021   Urinary tract infectious disease 11/09/2021   Vitamin D deficiency    Weight decrease     Past Surgical History:  Procedure Laterality Date   CATARACT EXTRACTION W/ INTRAOCULAR LENS IMPLANT Left 04/14/2023   CHOLECYSTECTOMY     gall stone surgery     intestional surgery     blockage   SPLENECTOMY, TOTAL      Current Medications: Active Medications[1]   Allergies:   Lexapro [escitalopram], Metoprolol, Penicillins, Sertraline, and Lisinopril   Social History   Socioeconomic History   Marital status: Widowed    Spouse name: Not on file   Number of children: Not on file   Years of education: Not on file   Highest education level: Not on file  Occupational History    Not on file  Tobacco Use   Smoking status: Every Day    Types: Cigarettes   Smokeless tobacco: Never  Substance and Sexual Activity   Alcohol use: No  Drug use: No   Sexual activity: Not on file  Other Topics Concern   Not on file  Social History Narrative   Not on file   Social Drivers of Health   Tobacco Use: High Risk (01/02/2025)   Patient History    Smoking Tobacco Use: Every Day    Smokeless Tobacco Use: Never    Passive Exposure: Not on file  Financial Resource Strain: Not on file  Food Insecurity: Not on file  Transportation Needs: Not on file  Physical Activity: Not on file  Stress: Not on file  Social Connections: Not on file  Depression (EYV7-0): Not on file  Alcohol Screen: Not on file  Housing: Not on file  Utilities: Not on file  Health Literacy: Not on file     Family History: The patient's family history includes Cancer in her father; Diabetes in her brother and sister; Heart disease in her mother. ROS:   Please see the history of present illness.    All 14 point review of systems negative except as described per history of present illness.  EKGs/Labs/Other Studies Reviewed:    The following studies were reviewed today:   EKG:       Recent Labs: 04/26/2024: TSH 1.07  Recent Lipid Panel    Component Value Date/Time   CHOL 223 (H) 11/26/2023 1335   TRIG 125 11/26/2023 1335   HDL 56 11/26/2023 1335   CHOLHDL 4.0 11/26/2023 1335   LDLCALC 145 (H) 11/26/2023 1335    Physical Exam:    VS:  BP 126/88   Pulse 78   Ht 5' 7 (1.702 m)   Wt (!) 322 lb (146.1 kg)   SpO2 96%   BMI 50.43 kg/m     Wt Readings from Last 3 Encounters:  01/02/25 (!) 322 lb (146.1 kg)  05/25/24 (!) 307 lb 3.2 oz (139.3 kg)  11/26/23 (!) 327 lb 6.4 oz (148.5 kg)     GENERAL:  Well nourished, well developed in no acute distress NECK: No JVD; No carotid bruits CARDIAC: RRR, S1 and S2 present, 3/6 ejection systolic murmur CHEST:  Clear to auscultation without  rales, wheezing or rhonchi  Extremities: 1+ bilateral pitting pedal edema. Pulses bilaterally symmetric with radial 2+ and dorsalis pedis 2+ NEUROLOGIC:  Alert and oriented x 3  Medication Adjustments/Labs and Tests Ordered: Current medicines are reviewed at length with the patient today.  Concerns regarding medicines are outlined above.  Orders Placed This Encounter  Procedures   EKG 12-Lead   ECHOCARDIOGRAM COMPLETE   Meds ordered this encounter  Medications   isosorbide  mononitrate (IMDUR ) 30 MG 24 hr tablet    Sig: Take 1 tablet (30 mg total) by mouth daily.    Dispense:  30 tablet    Refill:  0    Please keep upcoming appointment for any future refills   nitroGLYCERIN  (NITROSTAT ) 0.4 MG SL tablet    Sig: Place 1 tablet (0.4 mg total) under the tongue every 5 (five) minutes as needed for chest pain.    Dispense:  75 tablet    Refill:  3   ezetimibe  (ZETIA ) 10 MG tablet    Sig: Take 1 tablet (10 mg total) by mouth daily.    Dispense:  90 tablet    Refill:  2   furosemide  (LASIX ) 40 MG tablet    Sig: Take 40 mg (1 tablet) every other day alternating with 80 mg (2 tablets).    Dispense:  135 tablet    Refill:  3    Signed, Briyonna Omara reddy Lizania Bouchard, MD, MPH, [facility]. 01/02/2025 12:11 PM    [facility]    [1]  Current Meds  Medication Sig   albuterol (PROVENTIL) (2.5 MG/3ML) 0.083% nebulizer solution Take 2.5 mg by nebulization 3 (three) times daily.   aspirin EC 81 MG tablet Take 81 mg by mouth daily with breakfast. Swallow whole.   atenolol  (TENORMIN ) 25 MG tablet Take 1 tablet (25 mg total) by mouth daily.   atorvastatin (LIPITOR) 80 MG tablet Take 80 mg by mouth daily.   Blood Pressure Monitoring (COMFORT TOUCH BP CUFF/LARGE) MISC 1 Device by Does not apply route 2 (two) times daily. Per instructions given in office   Budeson-Glycopyrrol-Formoterol (BREZTRI AEROSPHERE) 160-9-4.8 MCG/ACT AERO Inhale 2 puffs into the lungs 2 (two) times daily as  needed for shortness of breath (shortness of breath).   cetirizine (ZYRTEC) 10 MG tablet Take 10 mg by mouth daily.   gabapentin  (NEURONTIN ) 600 MG tablet Take 600 mg by mouth 3 (three) times daily.   hydrOXYzine (ATARAX/VISTARIL) 50 MG tablet Take 50 mg by mouth every 8 (eight) hours as needed for anxiety.   insulin aspart (FIASP FLEXTOUCH) 100 UNIT/ML FlexTouch Pen Inject 30 Units into the skin 3 (three) times daily. Per sliding scale, max of 30 units   insulin degludec (TRESIBA FLEXTOUCH) 200 UNIT/ML FlexTouch Pen Inject 60 Units into the skin at bedtime.   INVOKANA 300 MG TABS tablet Take 300 mg by mouth daily.   ketoconazole  (NIZORAL ) 2 % shampoo Apply 1 Application topically 2 (two) times a week.   magnesium oxide (MAG-OX) 400 (240 Mg) MG tablet Take 1 tablet by mouth daily.   omeprazole (PRILOSEC) 20 MG capsule Take 20 mg by mouth daily before breakfast.   ondansetron (ZOFRAN) 8 MG tablet Take 8 mg by mouth every 12 (twelve) hours as needed for nausea or vomiting.   potassium chloride  SA (K-DUR,KLOR-CON ) 20 MEQ tablet Take 20 mEq by mouth daily.   REPATHA  SURECLICK 140 MG/ML SOAJ Inject 140 mg into the skin every 14 (fourteen) days.   triamcinolone cream (KENALOG) 0.1 % Apply 1 Application topically 2 (two) times daily as needed.   Vitamin D, Cholecalciferol, 25 MCG (1000 UT) CAPS Take 1,000 Units by mouth daily at 6 (six) AM.   [DISCONTINUED] ezetimibe  (ZETIA ) 10 MG tablet Take 1 tablet (10 mg total) by mouth daily.   [DISCONTINUED] furosemide  (LASIX ) 40 MG tablet Take 40 mg by mouth daily.   [DISCONTINUED] isosorbide  mononitrate (IMDUR ) 30 MG 24 hr tablet Take 1 tablet (30 mg total) by mouth daily.   [DISCONTINUED] nitroGLYCERIN  (NITROSTAT ) 0.4 MG SL tablet Place 1 tablet (0.4 mg total) under the tongue every 5 (five) minutes as needed for chest pain.   "

## 2025-01-02 NOTE — Assessment & Plan Note (Signed)
 Last panel to review is from 04/26/2024. Not at goal.  Repeat lipid panel previously ordered she had the blood draw done today at the office. Will review results once available.  Continue atorvastatin 80 mg once daily Zetia  10 mg once daily Repatha  140 mg subcutaneous injection once every 14 days.  She wishes to titrate down the medications. If lipid panel levels show improvement in optimal we will consider discontinuing Zetia .

## 2025-01-02 NOTE — Assessment & Plan Note (Signed)
 Prior echocardiogram was from July 2024 at [facility] V-max 2.4 m/s, mean gradient 15 mmHg and associated with mild aortic insufficiency.  Follow-up with repeat transthoracic echocardiogram for interval change assessment.

## 2025-01-02 NOTE — Patient Instructions (Signed)
 Medication Instructions:  Your physician has recommended you make the following change in your medication:   Increase your Lasix  to 40 mg alternating with 80 mg every other day  *If you need a refill on your cardiac medications before your next appointment, please call your pharmacy*   Lab Work: None ordered If you have labs (blood work) drawn today and your tests are completely normal, you will receive your results only by: MyChart Message (if you have MyChart) OR A paper copy in the mail If you have any lab test that is abnormal or we need to change your treatment, we will call you to review the results.  Testing/Procedures: Your physician has requested that you have an echocardiogram. Echocardiography is a painless test that uses sound waves to create images of your heart. It provides your doctor with information about the size and shape of your heart and how well your hearts chambers and valves are working. This procedure takes approximately one hour. There are no restrictions for this procedure. Please do NOT wear cologne, perfume, aftershave, or lotions (deodorant is allowed). Please arrive 15 minutes prior to your appointment time.  Please note: We ask at that you not bring children with you during ultrasound (echo/ vascular) testing. Due to room size and safety concerns, children are not allowed in the ultrasound rooms during exams. Our front office staff cannot provide observation of children in our lobby area while testing is being conducted. An adult accompanying a patient to their appointment will only be allowed in the ultrasound room at the discretion of the ultrasound technician under special circumstances. We apologize for any inconvenience.  Follow-Up: At [facility], you and your health needs are our priority.  As part of our continuing mission to provide you with exceptional heart care, we have created designated Provider Care Teams.  These Care Teams include your  primary Cardiologist (physician) and Advanced Practice Providers (APPs -  Physician Assistants and Nurse Practitioners) who all work together to provide you with the care you need, when you need it.  We recommend signing up for the patient portal called MyChart.  Sign up information is provided on this After Visit Summary.  MyChart is used to connect with patients for Virtual Visits (Telemedicine).  Patients are able to view lab/test results, encounter notes, upcoming appointments, etc.  Non-urgent messages can be sent to your provider as well.   To learn more about what you can do with MyChart, go to [email or web address].    Your next appointment:   2 month(s)  The format for your next appointment:   In Person  Provider:   Alean Madireddy, MD   Other Instructions Echocardiogram An echocardiogram is a test that uses sound waves (ultrasound) to produce images of the heart. Images from an echocardiogram can provide important information about: Heart size and shape. The size and thickness and movement of your heart's walls. Heart muscle function and strength. Heart valve function or if you have stenosis. Stenosis is when the heart valves are too narrow. If blood is flowing backward through the heart valves (regurgitation). A tumor or infectious growth around the heart valves. Areas of heart muscle that are not working well because of poor blood flow or injury from a heart attack. Aneurysm detection. An aneurysm is a weak or damaged part of an artery wall. The wall bulges out from the normal force of blood pumping through the body. Tell a health care provider about: Any allergies you have. All medicines you are  taking, including vitamins, herbs, eye drops, creams, and over-the-counter medicines. Any blood disorders you have. Any surgeries you have had. Any medical conditions you have. Whether you are pregnant or may be pregnant. What are the risks? Generally, this is a safe  test. However, problems may occur, including an allergic reaction to dye (contrast) that may be used during the test. What happens before the test? No specific preparation is needed. You may eat and drink normally. What happens during the test? You will take off your clothes from the waist up and put on a hospital gown. Electrodes or electrocardiogram (ECG)patches may be placed on your chest. The electrodes or patches are then connected to a device that monitors your heart rate and rhythm. You will lie down on a table for an ultrasound exam. A gel will be applied to your chest to help sound waves pass through your skin. A handheld device, called a transducer, will be pressed against your chest and moved over your heart. The transducer produces sound waves that travel to your heart and bounce back (or echo back) to the transducer. These sound waves will be captured in real-time and changed into images of your heart that can be viewed on a video monitor. The images will be recorded on a computer and reviewed by your health care provider. You may be asked to change positions or hold your breath for a short time. This makes it easier to get different views or better views of your heart. In some cases, you may receive contrast through an IV in one of your veins. This can improve the quality of the pictures from your heart. The procedure may vary among health care providers and hospitals.   What can I expect after the test? You may return to your normal, everyday life, including diet, activities, and medicines, unless your health care provider tells you not to do that. Follow these instructions at home: It is up to you to get the results of your test. Ask your health care provider, or the department that is doing the test, when your results will be ready. Keep all follow-up visits. This is important. Summary An echocardiogram is a test that uses sound waves (ultrasound) to produce images of the  heart. Images from an echocardiogram can provide important information about the size and shape of your heart, heart muscle function, heart valve function, and other possible heart problems. You do not need to do anything to prepare before this test. You may eat and drink normally. After the echocardiogram is completed, you may return to your normal, everyday life, unless your health care provider tells you not to do that. This information is not intended to replace advice given to you by your health care provider. Make sure you discuss any questions you have with your health care provider. Document Revised: 07/31/2020 Document Reviewed: 07/31/2020 Elsevier Patient Education  2021 Elsevier Inc.   Important Information About Sugar

## 2025-01-02 NOTE — Assessment & Plan Note (Signed)
 S/p CABG in 2022. Last tress test with nuclear imaging July 2024 at [facility] without ischemia.  Relatively asymptomatic. Continue aspirin 81 mg once daily. Continue atorvastatin 80 mg once daily.  Remains on metoprolol 25 mg once daily and Imdur  30 mg once daily.

## 2025-01-02 NOTE — Assessment & Plan Note (Signed)
 Chronic heart failure with preserved ejection fraction, LVEF 55 to 60% on echocardiogram July 2024 at [facility].  Bilateral lower extremity edema  Discussed extensively about salt restriction and recommended to keep it below 2 g/day and avoid entirely if possible.  Continue furosemide  in addition to her current 40 mg once daily advised her to take an additional 40 mg dose every other day in the morning.  Continue atenolol  25 mg once daily. Advised her to notify us  of any significant chest pains with higher dose of Lasix  in about 1 week to 10 days.    If no significant improvement will have her further increase the Lasix  dose to 80 mg twice daily.  Will have her return for close follow-up tentatively in 6 to 8 weeks.

## 2025-01-03 ENCOUNTER — Ambulatory Visit: Payer: Self-pay

## 2025-01-03 LAB — LIPID PANEL
Chol/HDL Ratio: 3.7 ratio (ref 0.0–4.4)
Cholesterol, Total: 218 mg/dL — ABNORMAL HIGH (ref 100–199)
HDL: 59 mg/dL
LDL Chol Calc (NIH): 136 mg/dL — ABNORMAL HIGH (ref 0–99)
Triglycerides: 127 mg/dL (ref 0–149)
VLDL Cholesterol Cal: 23 mg/dL (ref 5–40)

## 2025-01-04 ENCOUNTER — Other Ambulatory Visit: Payer: Self-pay

## 2025-01-04 MED ORDER — ROSUVASTATIN CALCIUM 5 MG PO TABS: 5.0000 mg | ORAL_TABLET | Freq: Every day | ORAL | 3 refills | Status: AC

## 2025-01-27 ENCOUNTER — Ambulatory Visit

## 2025-02-23 ENCOUNTER — Ambulatory Visit

## 2025-03-02 ENCOUNTER — Ambulatory Visit
